# Patient Record
Sex: Female | Born: 2000 | Race: White | Hispanic: No | Marital: Single | Smoking: Former smoker
Health system: Southern US, Community
[De-identification: ages and names within clinical notes are randomized; demographics above are authoritative.]

## PROBLEM LIST (undated history)

## (undated) DIAGNOSIS — F909 Attention-deficit hyperactivity disorder, unspecified type: Secondary | ICD-10-CM

## (undated) DIAGNOSIS — G43109 Migraine with aura, not intractable, without status migrainosus: Secondary | ICD-10-CM

## (undated) DIAGNOSIS — J45909 Unspecified asthma, uncomplicated: Secondary | ICD-10-CM

## (undated) DIAGNOSIS — F419 Anxiety disorder, unspecified: Secondary | ICD-10-CM

## (undated) DIAGNOSIS — F32A Depression, unspecified: Secondary | ICD-10-CM

## (undated) DIAGNOSIS — T7840XA Allergy, unspecified, initial encounter: Secondary | ICD-10-CM

## (undated) HISTORY — DX: Anxiety disorder, unspecified: F41.9

## (undated) HISTORY — PX: MOUTH SURGERY: SHX715

## (undated) HISTORY — DX: Unspecified asthma, uncomplicated: J45.909

## (undated) HISTORY — DX: Depression, unspecified: F32.A

## (undated) HISTORY — DX: Allergy, unspecified, initial encounter: T78.40XA

## (undated) HISTORY — DX: Migraine with aura, not intractable, without status migrainosus: G43.109

---
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
## 2018-01-20 MED FILL — FLUoxetine HCL 40 MG CAPS: 40 | 30 days supply | Qty: 30 | Fill #2

## 2018-01-20 MED FILL — BLISOVI 24 FE TABLET: 1-20 | 28 days supply | Qty: 28 | Fill #3

## 2018-01-20 MED FILL — SM FEXOFENADINE HCL 180 MG: 180 MG | 90 days supply | Qty: 90 | Fill #1

## 2018-01-20 MED FILL — busPIRone HCL 10 MG TABS: 10 | 30 days supply | Qty: 60 | Fill #1

## 2018-01-23 DIAGNOSIS — G43009 Migraine without aura, not intractable, without status migrainosus: Secondary | ICD-10-CM | POA: Diagnosis not present

## 2018-01-23 DIAGNOSIS — S8002XA Contusion of left knee, initial encounter: Secondary | ICD-10-CM | POA: Diagnosis not present

## 2018-01-23 DIAGNOSIS — F419 Anxiety disorder, unspecified: Secondary | ICD-10-CM | POA: Diagnosis not present

## 2018-02-06 IMAGING — CR DG ANKLE COMPLETE 3+V*R*
3 series · 3 of 3 positions shown · non-contrast
Comparison: None.

CLINICAL DATA: Bilateral posterior ankle pain with swelling and
bruising for 1 month. No known injury.

EXAM:
RIGHT ANKLE - COMPLETE 3+ VIEW

[t ankle joint ap right]
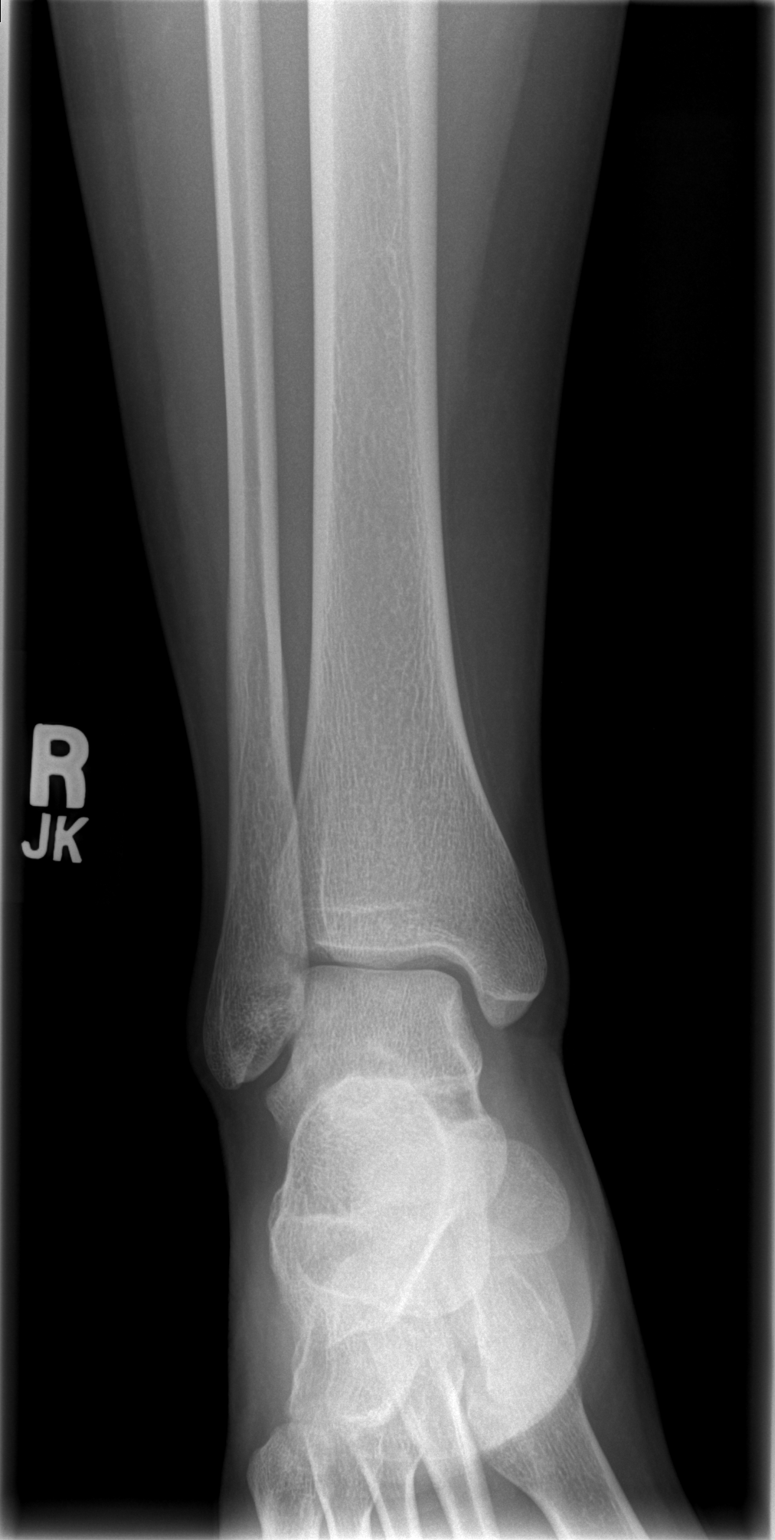

[t ankle joint oblique right]
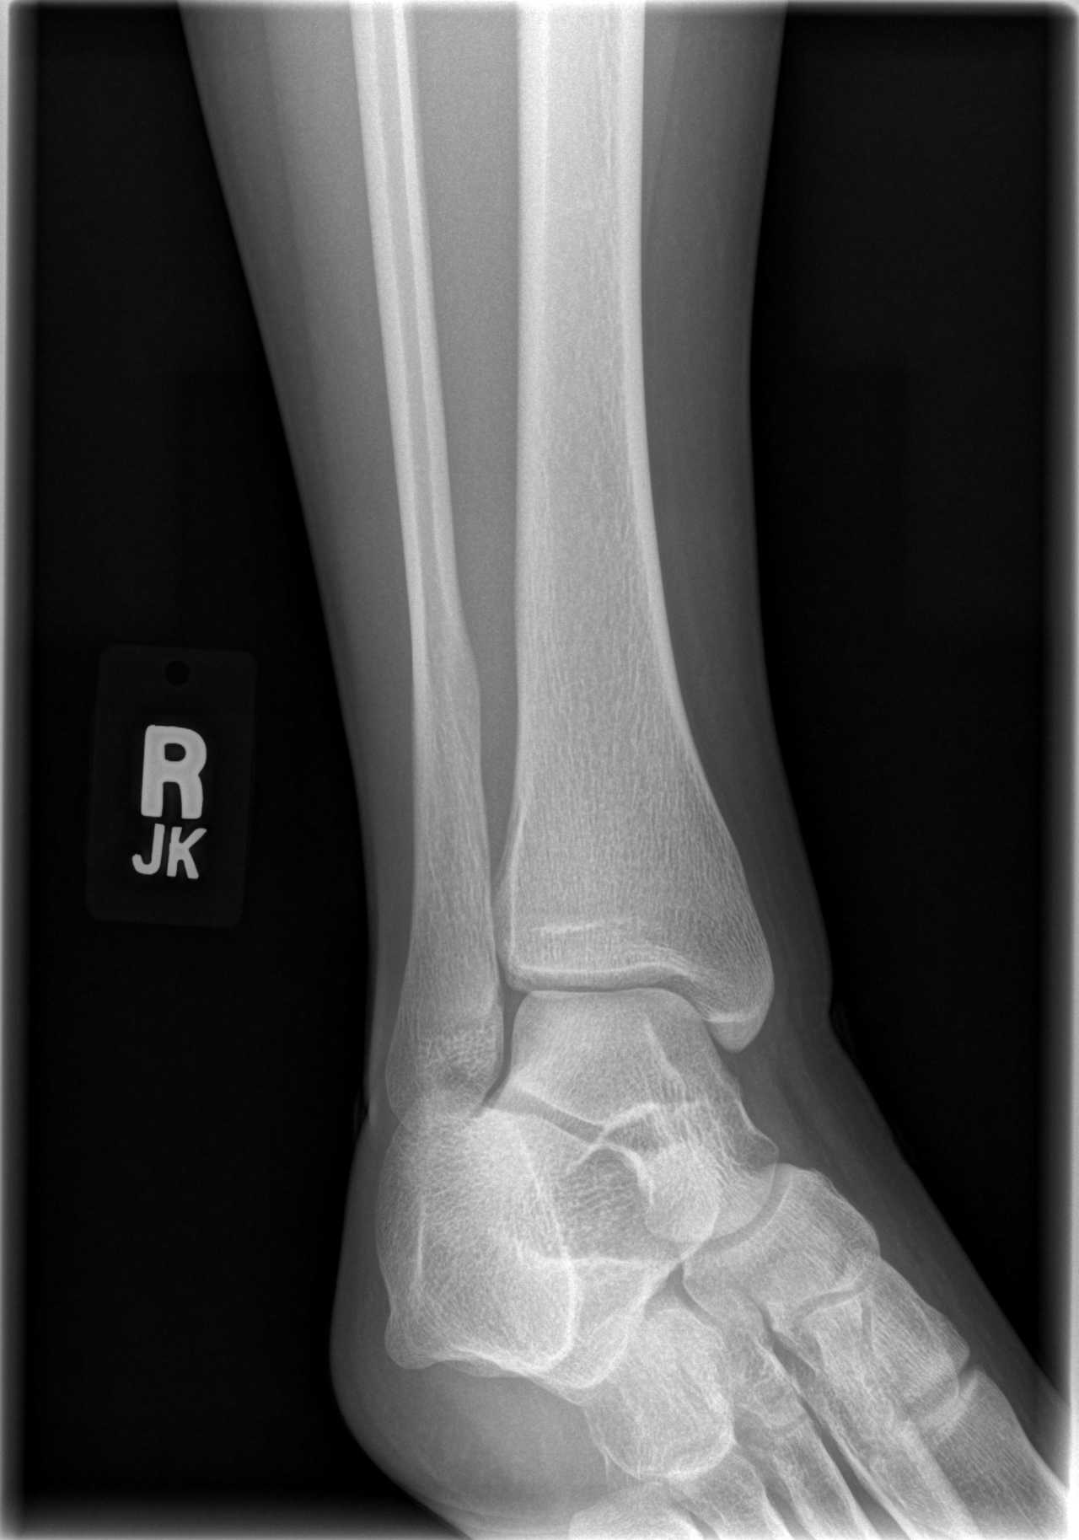

[t ankle joint lat right]
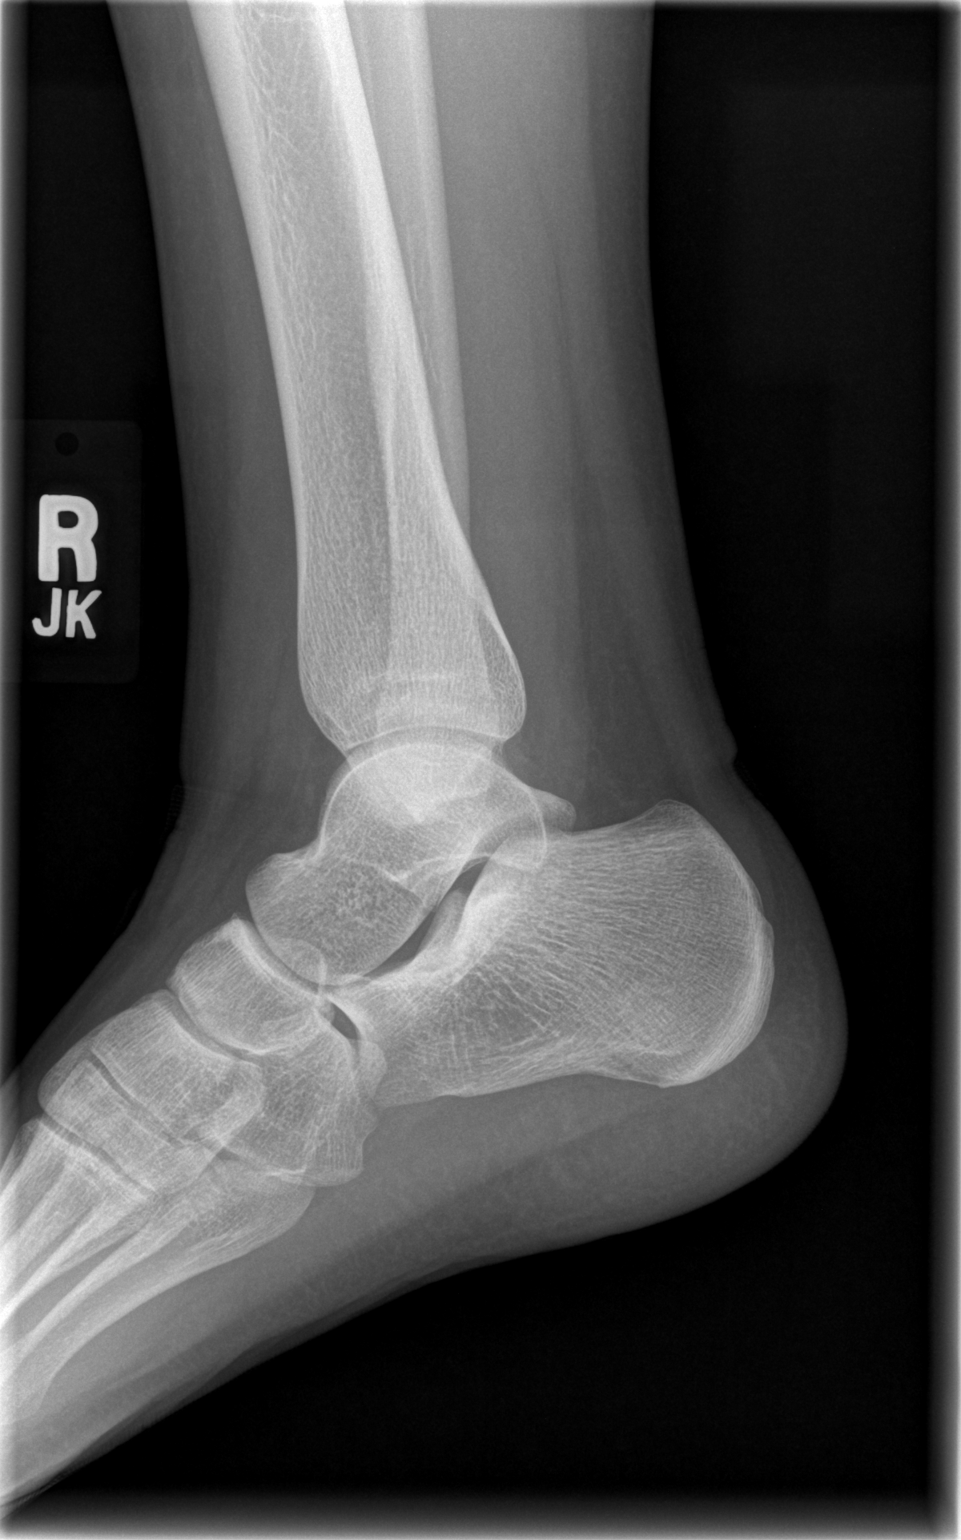

[3 of 3 positions shown; findings below may reference images not displayed]

FINDINGS: There is no evidence of fracture, dislocation, or joint effusion.
There is no evidence of arthropathy or other focal bone abnormality.
Soft tissues are unremarkable. Normal Achilles tendon shadow.
IMPRESSION: Normal study.

## 2018-03-03 MED FILL — BLISOVI 24 FE TABLET: 1-20 | 84 days supply | Qty: 84 | Fill #4

## 2018-03-03 MED FILL — busPIRone HCL 10 MG TABS: 10 | 30 days supply | Qty: 60 | Fill #2

## 2018-03-03 MED FILL — FLUoxetine HCL 40 MG CAPS: 40 | 30 days supply | Qty: 30 | Fill #3

## 2018-06-26 MED FILL — FLUoxetine HCL 40 MG CAPS: 40 | 30 days supply | Qty: 30 | Fill #4

## 2018-06-27 DIAGNOSIS — G43009 Migraine without aura, not intractable, without status migrainosus: Secondary | ICD-10-CM | POA: Diagnosis not present

## 2018-06-27 DIAGNOSIS — F419 Anxiety disorder, unspecified: Secondary | ICD-10-CM | POA: Diagnosis not present

## 2018-06-27 DIAGNOSIS — J301 Allergic rhinitis due to pollen: Secondary | ICD-10-CM | POA: Diagnosis not present

## 2018-06-28 MED FILL — SUMATRIPTAN SUCC 25 MG TAB: 25 | 90 days supply | Qty: 20 | Fill #0

## 2018-06-28 MED FILL — ALPRAZolam 0.25 MG TABS: 0.25 | 30 days supply | Qty: 15 | Fill #0

## 2018-06-28 MED FILL — busPIRone HCL 10 MG TABS: 10 | 90 days supply | Qty: 180 | Fill #0

## 2018-09-21 MED FILL — FLUoxetine HCL 40 MG CAPS: 40 | 90 days supply | Qty: 90 | Fill #0

## 2018-09-21 MED FILL — busPIRone HCL 10 MG TABS: 10 | 90 days supply | Qty: 180 | Fill #1

## 2018-11-06 DIAGNOSIS — F419 Anxiety disorder, unspecified: Secondary | ICD-10-CM | POA: Diagnosis not present

## 2018-11-06 DIAGNOSIS — G43009 Migraine without aura, not intractable, without status migrainosus: Secondary | ICD-10-CM | POA: Diagnosis not present

## 2018-11-06 MED FILL — BLISOVI 24 FE TABLET: 1-20 | 84 days supply | Qty: 84 | Fill #0

## 2019-01-10 DIAGNOSIS — F419 Anxiety disorder, unspecified: Secondary | ICD-10-CM | POA: Diagnosis not present

## 2019-01-10 MED FILL — FLUoxetine HCL 20 MG CAPS: 20 | 30 days supply | Qty: 30 | Fill #0

## 2019-01-10 MED FILL — ALPRAZolam 0.25 MG TABS: 0.25 | 30 days supply | Qty: 15 | Fill #0

## 2019-01-29 MED FILL — BLISOVI 24 FE 1-20 MG-MCG(2: 1-20 | 84 days supply | Qty: 84 | Fill #1

## 2019-03-07 MED FILL — FEXOFENADINE HCL 180 MG TAB: 180 | 30 days supply | Qty: 30 | Fill #0

## 2019-03-07 MED FILL — FLUoxetine HCL 20 MG CAPS: 20 | 30 days supply | Qty: 30 | Fill #0

## 2019-03-19 MED FILL — SUMATRIPTAN SUCC 25 MG TAB: 25 | 90 days supply | Qty: 20 | Fill #0

## 2019-03-21 DIAGNOSIS — F419 Anxiety disorder, unspecified: Secondary | ICD-10-CM | POA: Diagnosis not present

## 2019-03-21 MED FILL — SERTRALINE HCL 50 MG TABLET: 50 | 30 days supply | Qty: 30 | Fill #0 | Status: TO

## 2019-04-23 MED FILL — SERTRALINE HCL 50 MG TABLET: 50 | 30 days supply | Qty: 30 | Fill #0

## 2019-04-25 MED FILL — FEXOFENADINE HCL 180 MG TAB: 180 | 30 days supply | Qty: 30 | Fill #0

## 2019-04-25 MED FILL — BLISOVI 24 FE 1-20 MG-MCG(2: 1-20 | 84 days supply | Qty: 84 | Fill #0

## 2019-04-26 MED FILL — ALBUTEROL SULFATE HFA 108 (: 108 (90 BAS | 25 days supply | Qty: 18 | Fill #0

## 2019-05-17 MED FILL — SERTRALINE HCL 50 MG TABLET: 50 | 30 days supply | Qty: 30 | Fill #0

## 2019-06-18 MED FILL — SERTRALINE HCL 50 MG TABLET: 50 | 30 days supply | Qty: 30 | Fill #1

## 2019-07-04 DIAGNOSIS — F419 Anxiety disorder, unspecified: Secondary | ICD-10-CM | POA: Diagnosis not present

## 2019-07-04 MED FILL — ALPRAZolam 0.5 MG TABS: 0.5 | 30 days supply | Qty: 30 | Fill #0

## 2019-07-04 MED FILL — SERTRALINE HCL 100 MG TAB: 100 | 90 days supply | Qty: 90 | Fill #0

## 2019-07-17 MED FILL — BLISOVI 24 FE 1-20 MG-MCG(2: 1-20 | 28 days supply | Qty: 28 | Fill #0

## 2019-07-21 DIAGNOSIS — Z20828 Contact with and (suspected) exposure to other viral communicable diseases: Secondary | ICD-10-CM | POA: Diagnosis not present

## 2019-09-25 MED FILL — SERTRALINE HCL 100 MG TAB: 100 | 90 days supply | Qty: 90 | Fill #0

## 2019-09-26 MED FILL — BLISOVI 24 FE 1-20 MG-MCG(2: 1-20 | 84 days supply | Qty: 84 | Fill #0

## 2019-09-26 MED FILL — ALPRAZolam 0.5 MG TABS: 0.5 | 30 days supply | Qty: 30 | Fill #0

## 2019-09-28 DIAGNOSIS — Z20828 Contact with and (suspected) exposure to other viral communicable diseases: Secondary | ICD-10-CM | POA: Diagnosis not present

## 2020-01-11 DIAGNOSIS — Z20828 Contact with and (suspected) exposure to other viral communicable diseases: Secondary | ICD-10-CM | POA: Diagnosis not present

## 2020-02-27 DIAGNOSIS — Z6820 Body mass index (BMI) 20.0-20.9, adult: Secondary | ICD-10-CM | POA: Diagnosis not present

## 2020-02-27 DIAGNOSIS — Z01419 Encounter for gynecological examination (general) (routine) without abnormal findings: Secondary | ICD-10-CM | POA: Diagnosis not present

## 2020-06-26 DIAGNOSIS — Z20822 Contact with and (suspected) exposure to covid-19: Secondary | ICD-10-CM | POA: Diagnosis not present

## 2020-12-05 ENCOUNTER — Telehealth: Payer: 59 | Admitting: Nurse Practitioner

## 2020-12-05 DIAGNOSIS — N3000 Acute cystitis without hematuria: Secondary | ICD-10-CM | POA: Diagnosis not present

## 2020-12-05 MED ORDER — CEPHALEXIN 500 MG PO CAPS
500.0000 mg | ORAL_CAPSULE | Freq: Two times a day (BID) | ORAL | 0 refills | Status: DC
Start: 2020-12-05 — End: 2021-05-12

## 2020-12-05 NOTE — Progress Notes (Signed)
We are sorry that you are not feeling well.  Here is how we plan to help!  Based on what you shared with me it looks like you most likely have a simple urinary tract infection.  A UTI (Urinary Tract Infection) is a bacterial infection of the bladder.  Most cases of urinary tract infections are simple to treat but a key part of your care is to encourage you to drink plenty of fluids and watch your symptoms carefully.  I have prescribed Keflex 500 mg twice a day for 7 days.  Your symptoms should gradually improve. If the burning in your urine worsens, you develop worsening fever, back pain or pelvic pain or if your symptoms do not resolve after completing the antibiotic, then you wll need a fac eto face visit.  Urinary tract infections can be prevented by drinking plenty of water to keep your body hydrated.  Also be sure when you wipe, wipe from front to back and don't hold it in!  If possible, empty your bladder every 4 hours.  Your e-visit answers were reviewed by a board certified advanced clinical practitioner to complete your personal care plan.  Depending on the condition, your plan could have included both over the counter or prescription medications.  If there is a problem please reply  once you have received a response from your provider.  Your safety is important to [place].  If you have drug allergies check your prescription carefully.    You can use MyChart to ask questions about todays visit, request a non-urgent call back, or ask for a work or school excuse for 24 hours related to this e-Visit. If it has been greater than 24 hours you will need to follow up with your provider, or enter a new e-Visit to address those concerns.   You will get an e-mail in the next two days asking about your experience.  I hope that your e-visit has been valuable and will speed your recovery. Thank you for using e-visits.  5-10 minutes spent reviewing and documenting in chart.

## 2020-12-19 ENCOUNTER — Telehealth: Payer: 59 | Admitting: Physician Assistant

## 2020-12-19 DIAGNOSIS — R102 Pelvic and perineal pain: Secondary | ICD-10-CM

## 2020-12-19 DIAGNOSIS — N12 Tubulo-interstitial nephritis, not specified as acute or chronic: Secondary | ICD-10-CM

## 2020-12-19 NOTE — Progress Notes (Signed)
Based on what you shared with me, I feel your condition warrants further evaluation and I recommend that you be seen for a face to face office visit. Giving ongoing and severe symptoms, along with symptoms concerning for kidney infection, you need to be seen in person.    NOTE: If you entered your credit card information for this eVisit, you will not be charged. You may see a "hold" on your card for the $35 but that hold will drop off and you will not have a charge processed.   If you are having a true medical emergency please call 911.      For an urgent face to face visit, [facility] has five urgent care centers for your convenience:     [facility] [place] [facility] [place] Get Driving Directions [phone] [place], [place] [address] . 10 am - 6pm Monday - Friday    [place] [facility] [place] [facility]) Get Driving Directions [phone] [address] [place], [place] [address] . 10 am to 8 pm Monday-Friday . 12 pm to 8 pm [facility] Get Driving Directions [phone] [place], [place], [place] [address] . 8 am to 8 pm Monday-Friday . 9 am to 6 pm Saturday . 11 am to 6 pm Sunday     [facility] Get Driving Directions  [phone] [address] [place], [place] [address] . 8 am to 8 pm Monday-Friday . 8 am to 4 pm [facility] at [place] Get Driving Directions [phone] [address] [place], [place] [address] . 12 pm to 6 pm Monday-Friday      Your e-visit answers were reviewed by a board certified advanced clinical practitioner to complete your personal care plan.  Thank you for using e-Visits.

## 2020-12-20 DIAGNOSIS — H538 Other visual disturbances: Secondary | ICD-10-CM | POA: Diagnosis not present

## 2020-12-20 DIAGNOSIS — R5383 Other fatigue: Secondary | ICD-10-CM | POA: Diagnosis not present

## 2020-12-20 DIAGNOSIS — N3000 Acute cystitis without hematuria: Secondary | ICD-10-CM | POA: Diagnosis not present

## 2020-12-20 DIAGNOSIS — Z20822 Contact with and (suspected) exposure to covid-19: Secondary | ICD-10-CM | POA: Diagnosis not present

## 2020-12-20 DIAGNOSIS — R112 Nausea with vomiting, unspecified: Secondary | ICD-10-CM | POA: Diagnosis not present

## 2020-12-20 DIAGNOSIS — R109 Unspecified abdominal pain: Secondary | ICD-10-CM | POA: Diagnosis not present

## 2020-12-20 DIAGNOSIS — R300 Dysuria: Secondary | ICD-10-CM | POA: Diagnosis not present

## 2020-12-20 DIAGNOSIS — J45909 Unspecified asthma, uncomplicated: Secondary | ICD-10-CM | POA: Diagnosis not present

## 2020-12-23 DIAGNOSIS — R109 Unspecified abdominal pain: Secondary | ICD-10-CM | POA: Diagnosis not present

## 2020-12-23 DIAGNOSIS — N898 Other specified noninflammatory disorders of vagina: Secondary | ICD-10-CM | POA: Diagnosis not present

## 2020-12-23 DIAGNOSIS — R309 Painful micturition, unspecified: Secondary | ICD-10-CM | POA: Diagnosis not present

## 2020-12-23 DIAGNOSIS — R1032 Left lower quadrant pain: Secondary | ICD-10-CM | POA: Diagnosis not present

## 2020-12-23 DIAGNOSIS — R112 Nausea with vomiting, unspecified: Secondary | ICD-10-CM | POA: Diagnosis not present

## 2020-12-23 DIAGNOSIS — K5900 Constipation, unspecified: Secondary | ICD-10-CM | POA: Diagnosis not present

## 2020-12-23 DIAGNOSIS — R634 Abnormal weight loss: Secondary | ICD-10-CM | POA: Diagnosis not present

## 2021-01-09 DIAGNOSIS — F419 Anxiety disorder, unspecified: Secondary | ICD-10-CM | POA: Diagnosis not present

## 2021-01-09 DIAGNOSIS — R110 Nausea: Secondary | ICD-10-CM | POA: Diagnosis not present

## 2021-01-09 DIAGNOSIS — F509 Eating disorder, unspecified: Secondary | ICD-10-CM | POA: Diagnosis not present

## 2021-01-09 MED FILL — ALBUTEROL SULFATE HFA 108 (: 108 (90 BAS | 25 days supply | Qty: 18 | Fill #0

## 2021-01-09 MED FILL — ALPRAZolam 0.5 MG TABS: 0.5 | 30 days supply | Qty: 90 | Fill #0

## 2021-01-09 MED FILL — ONDANSETRON ODT 4 MG TABLET: 4 | 15 days supply | Qty: 45 | Fill #0

## 2021-01-12 ENCOUNTER — Other Ambulatory Visit (HOSPITAL_COMMUNITY): Payer: Self-pay

## 2021-01-14 DIAGNOSIS — R1032 Left lower quadrant pain: Secondary | ICD-10-CM | POA: Diagnosis not present

## 2021-01-19 DIAGNOSIS — S239XXA Sprain of unspecified parts of thorax, initial encounter: Secondary | ICD-10-CM | POA: Diagnosis not present

## 2021-01-19 DIAGNOSIS — Z041 Encounter for examination and observation following transport accident: Secondary | ICD-10-CM | POA: Diagnosis not present

## 2021-01-19 DIAGNOSIS — S139XXA Sprain of joints and ligaments of unspecified parts of neck, initial encounter: Secondary | ICD-10-CM | POA: Diagnosis not present

## 2021-01-21 MED FILL — ONDANSETRON ODT 4 MG TABLET: 4 | 15 days supply | Qty: 45 | Fill #0

## 2021-02-05 ENCOUNTER — Other Ambulatory Visit (HOSPITAL_BASED_OUTPATIENT_CLINIC_OR_DEPARTMENT_OTHER): Payer: Self-pay

## 2021-02-16 DIAGNOSIS — F509 Eating disorder, unspecified: Secondary | ICD-10-CM | POA: Diagnosis not present

## 2021-02-16 DIAGNOSIS — K5909 Other constipation: Secondary | ICD-10-CM | POA: Diagnosis not present

## 2021-03-04 DIAGNOSIS — N760 Acute vaginitis: Secondary | ICD-10-CM | POA: Diagnosis not present

## 2021-03-04 DIAGNOSIS — F509 Eating disorder, unspecified: Secondary | ICD-10-CM | POA: Diagnosis not present

## 2021-03-04 DIAGNOSIS — R309 Painful micturition, unspecified: Secondary | ICD-10-CM | POA: Diagnosis not present

## 2021-03-31 DIAGNOSIS — F331 Major depressive disorder, recurrent, moderate: Secondary | ICD-10-CM | POA: Diagnosis not present

## 2021-03-31 DIAGNOSIS — F509 Eating disorder, unspecified: Secondary | ICD-10-CM | POA: Diagnosis not present

## 2021-05-11 ENCOUNTER — Ambulatory Visit: Payer: 59 | Attending: Critical Care Medicine

## 2021-05-11 DIAGNOSIS — Z20822 Contact with and (suspected) exposure to covid-19: Secondary | ICD-10-CM | POA: Diagnosis not present

## 2021-05-12 ENCOUNTER — Emergency Department
Admission: EM | Admit: 2021-05-12 | Discharge: 2021-05-12 | Disposition: A | Discharge status: Home / Self Care | Payer: 59 | Source: Home / Self Care | Attending: Family Medicine | Admitting: Family Medicine

## 2021-05-12 ENCOUNTER — Encounter: Payer: Self-pay | Admitting: Emergency Medicine

## 2021-05-12 ENCOUNTER — Other Ambulatory Visit: Payer: Self-pay

## 2021-05-12 DIAGNOSIS — J029 Acute pharyngitis, unspecified: Secondary | ICD-10-CM | POA: Diagnosis not present

## 2021-05-12 LAB — NOVEL CORONAVIRUS, NAA: SARS-CoV-2, NAA: DETECTED — AB

## 2021-05-12 LAB — POCT RAPID STREP A (OFFICE): Rapid Strep A Screen: NEGATIVE

## 2021-05-12 LAB — SARS-COV-2, NAA 2 DAY TAT

## 2021-05-12 NOTE — ED Provider Notes (Signed)
[facility]    CSN: [id number] Arrival date & time: 05/12/21  1229      History   Chief Complaint Chief Complaint  Patient presents with   Otalgia    HPI CHRISTMAS FARACI is a 20 y.o. female.   HPI patient is here for viral symptoms.  She has a sore throat, ear pressure and pain, headache and body aches, and fever.  She went this morning for COVID test.  She is here today for evaluation of her ear pain.  Also complains of a sore throat.  She has decreased appetite and nausea but no vomiting or diarrhea.  No known exposure to COVID.  She is COVID vaccinated.  Past Medical History:  Diagnosis Date   Allergy    Asthma     There are no problems to display for this patient.   Past Surgical History:  Procedure Laterality Date   MOUTH SURGERY      OB History   No obstetric history on file.      Home Medications    Prior to Admission medications   Medication Sig Start Date End Date Taking? Authorizing Provider  albuterol (VENTOLIN HFA) 108 (90 Base) MCG/ACT inhaler INHALE 2 PUFF(S) BY MOUTH 4 TIMES A DAY 01/12/21 01/12/22    ALPRAZolam (XANAX) 0.5 MG tablet TAKE 1 TABLET BY MOUTH 3 TIMES A DAY X 30 DAYS AS NEEDED FOR ANXIETY 01/12/21 07/11/21    Fexofenadine HCl (ALLEGRA PO) Take by mouth.    [provider]  ondansetron (ZOFRAN-ODT) 4 MG disintegrating tablet DISSOLVE 1 TABLET BY MOUTH EVERY 8 HOURS 01/12/21 01/12/22      Family History Family History  Problem Relation Age of Onset   Healthy Mother    Healthy Father     Social History Social History   Tobacco Use   Smoking status: Never    Passive exposure: Never   Smokeless tobacco: Never  Vaping Use   Vaping Use: Never used  Substance Use Topics   Alcohol use: Yes   Drug use: Not Currently     Allergies   Patient has no known allergies.   Review of Systems Review of Systems See HPI  Physical Exam Triage Vital Signs ED Triage Vitals  Enc Vitals Group     BP 05/12/21 1309  100/70     Pulse Rate 05/12/21 1309 (!) 101     Resp 05/12/21 1309 18     Temp 05/12/21 1309 99.7 F (37.6 C)     Temp Source 05/12/21 1309 Oral     SpO2 05/12/21 1309 100 %     Weight 05/12/21 1311 130 lb (59 kg)     Height 05/12/21 1311 5\' 7"  (1.702 m)     Head Circumference --      Peak Flow --      Pain Score 05/12/21 1310 4     Pain Loc --      Pain Edu? --      Excl. in [place]? --    No data found.  Updated Vital Signs BP 100/70 (BP Location: Right Arm)   Pulse (!) 101   Temp 99.7 F (37.6 C) (Oral)   Resp 18   Ht 5\' 7"  (1.702 m)   Wt 59 kg   SpO2 100%   BMI 20.36 kg/m  Physical Exam Constitutional:      General: She is not in acute distress.    Appearance: She is well-developed.     Comments: Appears tired  HENT:     Head: Normocephalic and atraumatic.     Ears:     Comments: Both TMs are slightly dull with no injection or bulging    Nose: Congestion present.     Mouth/Throat:     Pharynx: Posterior oropharyngeal erythema present.  Eyes:     Conjunctiva/sclera: Conjunctivae normal.     Pupils: Pupils are equal, round, and reactive to light.  Neck:     Comments: no anterior or posterior cervical nodes Cardiovascular:     Rate and Rhythm: Normal rate and regular rhythm.     Heart sounds: Normal heart sounds.  Pulmonary:     Effort: Pulmonary effort is normal. No respiratory distress.     Breath sounds: Normal breath sounds.  Abdominal:     General: There is no distension.     Palpations: Abdomen is soft.     Comments: No tenderness or organomegaly  Musculoskeletal:        General: Normal range of motion.     Cervical back: Normal range of motion.  Lymphadenopathy:     Cervical: No cervical adenopathy.  Skin:    General: Skin is warm and dry.  Neurological:     General: No focal deficit present.     Mental Status: She is alert.  Psychiatric:        Mood and Affect: Mood normal.     UC Treatments / Results  Labs (all labs ordered are listed, but  only abnormal results are displayed) Labs Reviewed  CULTURE, GROUP A STREP  POCT RAPID STREP A (OFFICE)    EKG   Radiology No results found.  Procedures Procedures (including critical care time)  Medications Ordered in UC Medications - No data to display  Initial Impression / Assessment and Plan / UC Course  I have reviewed the triage vital signs and the nursing notes.  Pertinent labs & imaging results that were available during my care of the patient were reviewed by me and considered in my medical decision making (see chart for details).     Rapid strep is negative.  Symptoms appear to be a viral syndrome.  COVID and flu testing done.  Patient is sent home with symptomatic care Final Clinical Impressions(s) / UC Diagnoses   Final diagnoses:  Acute pharyngitis, unspecified etiology     Discharge Instructions      Strep test is negative I think this is a viral infection You need to drink lots of fluids You need to use something like Flonase or a nasal steroid to reduce the pressure in your ears Wait for COVID test results No indication for antibiotics at this visit   ED Prescriptions   None    PDMP not reviewed this encounter.   Raylene Everts, MD 05/12/21 [phone]

## 2021-05-12 NOTE — Discharge Instructions (Addendum)
Strep test is negative I think this is a viral infection You need to drink lots of fluids You need to use something like Flonase or a nasal steroid to reduce the pressure in your ears Wait for COVID test results No indication for antibiotics at this visit

## 2021-05-12 NOTE — ED Triage Notes (Signed)
RT ear pain after swimming, congestion, runny nose, body aches, fever today, fatigue, slight cough, nausea, sore throat, Negative at home Covid test, PCR pending. Vaccinated

## 2021-05-14 DIAGNOSIS — F331 Major depressive disorder, recurrent, moderate: Secondary | ICD-10-CM | POA: Diagnosis not present

## 2021-05-14 DIAGNOSIS — F411 Generalized anxiety disorder: Secondary | ICD-10-CM | POA: Diagnosis not present

## 2021-05-15 LAB — CULTURE, GROUP A STREP: Strep A Culture: NEGATIVE

## 2021-05-20 DIAGNOSIS — F411 Generalized anxiety disorder: Secondary | ICD-10-CM | POA: Diagnosis not present

## 2021-05-20 DIAGNOSIS — F331 Major depressive disorder, recurrent, moderate: Secondary | ICD-10-CM | POA: Diagnosis not present

## 2021-06-03 DIAGNOSIS — F411 Generalized anxiety disorder: Secondary | ICD-10-CM | POA: Diagnosis not present

## 2021-06-03 DIAGNOSIS — F331 Major depressive disorder, recurrent, moderate: Secondary | ICD-10-CM | POA: Diagnosis not present

## 2021-06-11 DIAGNOSIS — F331 Major depressive disorder, recurrent, moderate: Secondary | ICD-10-CM | POA: Diagnosis not present

## 2021-06-11 DIAGNOSIS — F411 Generalized anxiety disorder: Secondary | ICD-10-CM | POA: Diagnosis not present

## 2021-06-19 ENCOUNTER — Other Ambulatory Visit (HOSPITAL_COMMUNITY): Payer: Self-pay

## 2021-06-19 DIAGNOSIS — L218 Other seborrheic dermatitis: Secondary | ICD-10-CM | POA: Diagnosis not present

## 2021-06-19 DIAGNOSIS — D225 Melanocytic nevi of trunk: Secondary | ICD-10-CM | POA: Diagnosis not present

## 2021-06-19 DIAGNOSIS — L503 Dermatographic urticaria: Secondary | ICD-10-CM | POA: Diagnosis not present

## 2021-06-19 DIAGNOSIS — D2262 Melanocytic nevi of left upper limb, including shoulder: Secondary | ICD-10-CM | POA: Diagnosis not present

## 2021-06-19 MED ORDER — KETOCONAZOLE 2 % EX CREA
TOPICAL_CREAM | CUTANEOUS | 2 refills | Status: AC
  Filled 2021-06-19: qty 30, 15d supply, fill #0

## 2021-06-19 MED ORDER — HYDROCORTISONE 2.5 % EX CREA
TOPICAL_CREAM | CUTANEOUS | 2 refills | Status: AC
  Filled 2021-06-19: qty 30, 15d supply, fill #0

## 2021-06-19 MED ORDER — BETAMETHASONE DIPROPIONATE 0.05 % EX LOTN
TOPICAL_LOTION | CUTANEOUS | 3 refills | Status: AC
  Filled 2021-06-19: qty 120, 30d supply, fill #0

## 2021-06-19 MED ORDER — TRIAMCINOLONE ACETONIDE 0.1 % EX CREA
TOPICAL_CREAM | CUTANEOUS | 0 refills | Status: AC
  Filled 2021-06-19: qty 30, 14d supply, fill #0

## 2021-06-25 ENCOUNTER — Other Ambulatory Visit (HOSPITAL_COMMUNITY): Payer: Self-pay

## 2021-06-25 DIAGNOSIS — F509 Eating disorder, unspecified: Secondary | ICD-10-CM | POA: Diagnosis not present

## 2021-06-25 DIAGNOSIS — G4700 Insomnia, unspecified: Secondary | ICD-10-CM | POA: Diagnosis not present

## 2021-06-25 DIAGNOSIS — F419 Anxiety disorder, unspecified: Secondary | ICD-10-CM | POA: Diagnosis not present

## 2021-06-25 MED ORDER — TRAZODONE HCL 50 MG PO TABS
ORAL_TABLET | ORAL | 1 refills | Status: DC
  Filled 2021-06-25: qty 30, 30d supply, fill #0

## 2021-06-30 DIAGNOSIS — F331 Major depressive disorder, recurrent, moderate: Secondary | ICD-10-CM | POA: Diagnosis not present

## 2021-06-30 DIAGNOSIS — F411 Generalized anxiety disorder: Secondary | ICD-10-CM | POA: Diagnosis not present

## 2021-07-09 DIAGNOSIS — Z0100 Encounter for examination of eyes and vision without abnormal findings: Secondary | ICD-10-CM | POA: Diagnosis not present

## 2021-07-14 DIAGNOSIS — F411 Generalized anxiety disorder: Secondary | ICD-10-CM | POA: Diagnosis not present

## 2021-07-14 DIAGNOSIS — F331 Major depressive disorder, recurrent, moderate: Secondary | ICD-10-CM | POA: Diagnosis not present

## 2021-08-06 DIAGNOSIS — F411 Generalized anxiety disorder: Secondary | ICD-10-CM | POA: Diagnosis not present

## 2021-08-06 DIAGNOSIS — F331 Major depressive disorder, recurrent, moderate: Secondary | ICD-10-CM | POA: Diagnosis not present

## 2021-08-27 DIAGNOSIS — F411 Generalized anxiety disorder: Secondary | ICD-10-CM | POA: Diagnosis not present

## 2021-08-27 DIAGNOSIS — F331 Major depressive disorder, recurrent, moderate: Secondary | ICD-10-CM | POA: Diagnosis not present

## 2021-09-11 DIAGNOSIS — F411 Generalized anxiety disorder: Secondary | ICD-10-CM | POA: Diagnosis not present

## 2021-09-11 DIAGNOSIS — F331 Major depressive disorder, recurrent, moderate: Secondary | ICD-10-CM | POA: Diagnosis not present

## 2021-10-13 ENCOUNTER — Other Ambulatory Visit (HOSPITAL_COMMUNITY): Payer: Self-pay

## 2021-10-13 DIAGNOSIS — F419 Anxiety disorder, unspecified: Secondary | ICD-10-CM | POA: Diagnosis not present

## 2021-10-13 DIAGNOSIS — Z23 Encounter for immunization: Secondary | ICD-10-CM | POA: Diagnosis not present

## 2021-10-13 DIAGNOSIS — G4700 Insomnia, unspecified: Secondary | ICD-10-CM | POA: Diagnosis not present

## 2021-10-13 DIAGNOSIS — F909 Attention-deficit hyperactivity disorder, unspecified type: Secondary | ICD-10-CM | POA: Diagnosis not present

## 2021-10-13 MED ORDER — BUPROPION HCL ER (XL) 150 MG PO TB24
ORAL_TABLET | ORAL | 0 refills | Status: DC
  Filled 2021-10-13: qty 180, 90d supply, fill #0

## 2021-10-13 MED ORDER — TRAZODONE HCL 50 MG PO TABS
ORAL_TABLET | ORAL | 1 refills | Status: DC
  Filled 2021-10-13: qty 30, 30d supply, fill #0

## 2021-12-14 ENCOUNTER — Other Ambulatory Visit (HOSPITAL_COMMUNITY): Payer: Self-pay

## 2021-12-14 DIAGNOSIS — F419 Anxiety disorder, unspecified: Secondary | ICD-10-CM | POA: Diagnosis not present

## 2021-12-14 DIAGNOSIS — F909 Attention-deficit hyperactivity disorder, unspecified type: Secondary | ICD-10-CM | POA: Diagnosis not present

## 2021-12-14 MED ORDER — BUPROPION HCL ER (SR) 100 MG PO TB12
100.0000 mg | ORAL_TABLET | Freq: Every day | ORAL | 0 refills | Status: DC
  Filled 2021-12-14: qty 90, 90d supply, fill #0

## 2021-12-14 MED ORDER — ALPRAZOLAM 0.5 MG PO TABS
0.5000 mg | ORAL_TABLET | Freq: Three times a day (TID) | ORAL | 0 refills | Status: DC | PRN
  Filled 2021-12-14: qty 90, 30d supply, fill #0

## 2021-12-15 ENCOUNTER — Other Ambulatory Visit (HOSPITAL_COMMUNITY): Payer: Self-pay

## 2022-01-05 DIAGNOSIS — F411 Generalized anxiety disorder: Secondary | ICD-10-CM | POA: Diagnosis not present

## 2022-01-05 DIAGNOSIS — F331 Major depressive disorder, recurrent, moderate: Secondary | ICD-10-CM | POA: Diagnosis not present

## 2022-01-12 DIAGNOSIS — F331 Major depressive disorder, recurrent, moderate: Secondary | ICD-10-CM | POA: Diagnosis not present

## 2022-01-12 DIAGNOSIS — F411 Generalized anxiety disorder: Secondary | ICD-10-CM | POA: Diagnosis not present

## 2022-02-10 DIAGNOSIS — F331 Major depressive disorder, recurrent, moderate: Secondary | ICD-10-CM | POA: Diagnosis not present

## 2022-02-10 DIAGNOSIS — F411 Generalized anxiety disorder: Secondary | ICD-10-CM | POA: Diagnosis not present

## 2022-02-22 DIAGNOSIS — F411 Generalized anxiety disorder: Secondary | ICD-10-CM | POA: Diagnosis not present

## 2022-02-22 DIAGNOSIS — F331 Major depressive disorder, recurrent, moderate: Secondary | ICD-10-CM | POA: Diagnosis not present

## 2022-03-01 DIAGNOSIS — Z1151 Encounter for screening for human papillomavirus (HPV): Secondary | ICD-10-CM | POA: Diagnosis not present

## 2022-03-01 DIAGNOSIS — Z6822 Body mass index (BMI) 22.0-22.9, adult: Secondary | ICD-10-CM | POA: Diagnosis not present

## 2022-03-01 DIAGNOSIS — Z01419 Encounter for gynecological examination (general) (routine) without abnormal findings: Secondary | ICD-10-CM | POA: Diagnosis not present

## 2022-03-01 DIAGNOSIS — Z124 Encounter for screening for malignant neoplasm of cervix: Secondary | ICD-10-CM | POA: Diagnosis not present

## 2022-03-03 LAB — HM PAP SMEAR: HPV, high-risk: NEGATIVE

## 2022-03-08 DIAGNOSIS — F331 Major depressive disorder, recurrent, moderate: Secondary | ICD-10-CM | POA: Diagnosis not present

## 2022-03-08 DIAGNOSIS — F411 Generalized anxiety disorder: Secondary | ICD-10-CM | POA: Diagnosis not present

## 2022-03-22 DIAGNOSIS — F411 Generalized anxiety disorder: Secondary | ICD-10-CM | POA: Diagnosis not present

## 2022-03-22 DIAGNOSIS — F331 Major depressive disorder, recurrent, moderate: Secondary | ICD-10-CM | POA: Diagnosis not present

## 2022-03-29 ENCOUNTER — Other Ambulatory Visit (HOSPITAL_COMMUNITY): Payer: Self-pay

## 2022-03-29 DIAGNOSIS — E46 Unspecified protein-calorie malnutrition: Secondary | ICD-10-CM | POA: Diagnosis not present

## 2022-03-29 DIAGNOSIS — F902 Attention-deficit hyperactivity disorder, combined type: Secondary | ICD-10-CM | POA: Diagnosis not present

## 2022-03-29 DIAGNOSIS — R0602 Shortness of breath: Secondary | ICD-10-CM | POA: Diagnosis not present

## 2022-03-29 DIAGNOSIS — F411 Generalized anxiety disorder: Secondary | ICD-10-CM | POA: Diagnosis not present

## 2022-03-29 DIAGNOSIS — F322 Major depressive disorder, single episode, severe without psychotic features: Secondary | ICD-10-CM | POA: Diagnosis not present

## 2022-03-29 DIAGNOSIS — F419 Anxiety disorder, unspecified: Secondary | ICD-10-CM | POA: Diagnosis not present

## 2022-03-29 DIAGNOSIS — R5383 Other fatigue: Secondary | ICD-10-CM | POA: Diagnosis not present

## 2022-03-29 DIAGNOSIS — Z1389 Encounter for screening for other disorder: Secondary | ICD-10-CM | POA: Diagnosis not present

## 2022-03-29 MED ORDER — PROPRANOLOL HCL 20 MG PO TABS
20.0000 mg | ORAL_TABLET | Freq: Three times a day (TID) | ORAL | 0 refills | Status: DC | PRN
  Filled 2022-03-29: qty 90, 30d supply, fill #0

## 2022-04-05 DIAGNOSIS — F411 Generalized anxiety disorder: Secondary | ICD-10-CM | POA: Diagnosis not present

## 2022-04-05 DIAGNOSIS — F331 Major depressive disorder, recurrent, moderate: Secondary | ICD-10-CM | POA: Diagnosis not present

## 2022-04-06 ENCOUNTER — Other Ambulatory Visit (HOSPITAL_COMMUNITY): Payer: Self-pay

## 2022-04-10 ENCOUNTER — Emergency Department
Admission: EM | Admit: 2022-04-10 | Discharge: 2022-04-10 | Disposition: A | Discharge status: Home / Self Care | Payer: 59 | Source: Home / Self Care

## 2022-04-10 DIAGNOSIS — H6593 Unspecified nonsuppurative otitis media, bilateral: Secondary | ICD-10-CM

## 2022-04-10 DIAGNOSIS — J029 Acute pharyngitis, unspecified: Secondary | ICD-10-CM | POA: Diagnosis not present

## 2022-04-10 HISTORY — DX: Attention-deficit hyperactivity disorder, unspecified type: F90.9

## 2022-04-10 LAB — POCT RAPID STREP A (OFFICE): Rapid Strep A Screen: NEGATIVE

## 2022-04-10 MED ORDER — AZITHROMYCIN 250 MG PO TABS: 250.0000 mg | ORAL_TABLET | Freq: Every day | ORAL | 0 refills | Status: DC

## 2022-04-10 NOTE — ED Triage Notes (Signed)
Pt states that she has a sore throat, ear pain, body aches, cough and some chest congestion. X4 days  Pt states that she is vaccinated for covid. Pt states that she has had flu vaccine.  Pt states that she has had a negative at home covid test on 5/26.

## 2022-04-10 NOTE — Discharge Instructions (Addendum)
Continue antihistamine and add flonase and antibiotic for ear infection- strep was negative- culture is pending- you will be called with the results- rest and drink plenty of fluids

## 2022-04-10 NOTE — ED Provider Notes (Signed)
[facility]   MRN: [id number] DOB: 28-Apr-2001  Subjective:   Chief Complaint;  Chief Complaint  Patient presents with   Sore Throat    X4 days Sore throat, ear pain, body aches, cough and chest congestion.  Pt states that she has a sore throat, ear pain, body aches, cough and some chest congestion. X4 days   Pt states that she is vaccinated for covid. Pt states that she has had flu vaccine.   Pt states that she has had a negative at home covid test on 5/26.  Carmen Rodriguez is a 21 y.o. female presenting for 4 day hx of sinus pressure ear pain and sore throat- now having yellow dc from nose  No current facility-administered medications for this encounter.  Current Outpatient Medications:    azithromycin (ZITHROMAX) 250 MG tablet, Take 1 tablet (250 mg total) by mouth daily. Take first 2 tablets together, then 1 every day until finished., Disp: 6 tablet, Rfl: 0   Cetirizine HCl (ZYRTEC ALLERGY PO), Take by mouth., Disp: , Rfl:    MELATONIN CR PO, Take by mouth., Disp: , Rfl:    albuterol (VENTOLIN HFA) 108 (90 Base) MCG/ACT inhaler, INHALE 2 PUFF(S) BY MOUTH 4 TIMES A DAY, Disp: 8.5 g, Rfl: 11   ALPRAZolam (XANAX) 0.5 MG tablet, Take 1 tablet (0.5 mg total) by mouth 3 (three) times daily as needed for anxiety, Disp: 90 tablet, Rfl: 0   betamethasone dipropionate 0.05 % lotion, Apply 1 application to the scalp daily; Taper use as able, Disp: 120 mL, Rfl: 3   buPROPion ER (WELLBUTRIN SR) 100 MG 12 hr tablet, Take 1 tablet (100 mg total) by mouth daily., Disp: 90 tablet, Rfl: 0   Fexofenadine HCl (ALLEGRA PO), Take by mouth., Disp: , Rfl:    hydrocortisone 2.5 % cream, Apply 1 application on the skin nightly; Mix with ketoconazole for face-taper as able, Disp: 30 g, Rfl: 2   ketoconazole (NIZORAL) 2 % cream, Apply 1 application on the skin nightly; Mix with hydrocortisone for face-taper as able, Disp: 30 g, Rfl: 2   propranolol (INDERAL) 20 MG tablet, Take 1 tablet  (20 mg total) by mouth 3 (three) times daily as needed., Disp: 90 tablet, Rfl: 0   traZODone (DESYREL) 50 MG tablet, Take 1/2 to 1 tablet by mouth at night as needed for difficulty sleeping. Take 30 to 60 minutes before bed., Disp: 30 tablet, Rfl: 1   triamcinolone cream (KENALOG) 0.1 %, Apply to itchy areas on body one to two times daily as needed, Disp: 30 g, Rfl: 0   No Known Allergies  Past Medical History:  Diagnosis Date   ADHD    Allergy    Asthma      Review of Systems  All other systems reviewed and are negative.   Objective:   Vitals: BP 111/72 (BP Location: Left Arm)   Pulse 86   Temp 99.2 F (37.3 C) (Oral)   Resp 20   Ht '5\' 7"'$  (1.702 m)   Wt 138 lb (62.6 kg)   LMP 03/27/2022   SpO2 95%   BMI 21.61 kg/m   Physical Exam Vitals and nursing note reviewed.  Constitutional:      General: She is not in acute distress.    Appearance: She is well-developed.  HENT:     Head: Normocephalic and atraumatic.     Right Ear: A middle ear effusion is present. Tympanic membrane is erythematous.  Left Ear: A middle ear effusion is present. Tympanic membrane is erythematous.     Mouth/Throat:     Mouth: Mucous membranes are moist.     Pharynx: Posterior oropharyngeal erythema present. No pharyngeal swelling or oropharyngeal exudate.  Eyes:     Conjunctiva/sclera: Conjunctivae normal.  Cardiovascular:     Rate and Rhythm: Normal rate and regular rhythm.     Heart sounds: No murmur heard. Pulmonary:     Effort: Pulmonary effort is normal. No respiratory distress.     Breath sounds: Normal breath sounds.  Abdominal:     Palpations: Abdomen is soft.     Tenderness: There is no abdominal tenderness.  Musculoskeletal:        General: No swelling.     Cervical back: Neck supple.  Skin:    General: Skin is warm and dry.     Capillary Refill: Capillary refill takes less than 2 seconds.  Neurological:     Mental Status: She is alert.  Psychiatric:        Mood and  Affect: Mood normal.    Results for orders placed or performed during the hospital encounter of 04/10/22 (from the past 24 hour(s))  POCT rapid strep A     Status: None   Collection Time: 04/10/22  8:50 AM  Result Value Ref Range   Rapid Strep A Screen Negative Negative    No results found.     Assessment and Plan :   1. Sore throat   2. Bilateral non-suppurative otitis media     Meds ordered this encounter  Medications   azithromycin (ZITHROMAX) 250 MG tablet    Sig: Take 1 tablet (250 mg total) by mouth daily. Take first 2 tablets together, then 1 every day until finished.    Dispense:  6 tablet    Refill:  0    MDM:  Carmen Rodriguez is a 21 y.o. female presenting for viral type sx and recent ear pain bilat. Exam is consistent with bom rx for abx and continued work on decongestant with otc medication.she will fu with pcp if not improving or worse for reeval  Leida Lauth FNP-C MCN    Hezzie Bump, NP 04/10/22 [phone]

## 2022-04-13 DIAGNOSIS — E538 Deficiency of other specified B group vitamins: Secondary | ICD-10-CM | POA: Diagnosis not present

## 2022-04-13 DIAGNOSIS — F902 Attention-deficit hyperactivity disorder, combined type: Secondary | ICD-10-CM | POA: Diagnosis not present

## 2022-04-13 DIAGNOSIS — F339 Major depressive disorder, recurrent, unspecified: Secondary | ICD-10-CM | POA: Diagnosis not present

## 2022-04-13 DIAGNOSIS — F5089 Other specified eating disorder: Secondary | ICD-10-CM | POA: Diagnosis not present

## 2022-04-13 LAB — CULTURE, GROUP A STREP: Strep A Culture: NEGATIVE

## 2022-04-19 DIAGNOSIS — F411 Generalized anxiety disorder: Secondary | ICD-10-CM | POA: Diagnosis not present

## 2022-04-19 DIAGNOSIS — F331 Major depressive disorder, recurrent, moderate: Secondary | ICD-10-CM | POA: Diagnosis not present

## 2022-04-19 DIAGNOSIS — E538 Deficiency of other specified B group vitamins: Secondary | ICD-10-CM | POA: Diagnosis not present

## 2022-04-20 DIAGNOSIS — E538 Deficiency of other specified B group vitamins: Secondary | ICD-10-CM | POA: Diagnosis not present

## 2022-04-27 ENCOUNTER — Other Ambulatory Visit (HOSPITAL_COMMUNITY): Payer: Self-pay

## 2022-04-27 DIAGNOSIS — D519 Vitamin B12 deficiency anemia, unspecified: Secondary | ICD-10-CM | POA: Diagnosis not present

## 2022-04-27 DIAGNOSIS — F322 Major depressive disorder, single episode, severe without psychotic features: Secondary | ICD-10-CM | POA: Diagnosis not present

## 2022-04-27 DIAGNOSIS — F410 Panic disorder [episodic paroxysmal anxiety] without agoraphobia: Secondary | ICD-10-CM | POA: Diagnosis not present

## 2022-04-27 MED ORDER — PROPRANOLOL HCL 20 MG PO TABS
20.0000 mg | ORAL_TABLET | Freq: Three times a day (TID) | ORAL | 0 refills | Status: DC | PRN
  Filled 2022-04-27: qty 40, 14d supply, fill #0

## 2022-04-28 ENCOUNTER — Other Ambulatory Visit (HOSPITAL_COMMUNITY): Payer: Self-pay

## 2022-04-28 MED ORDER — LAMOTRIGINE 25 MG PO TABS
25.0000 mg | ORAL_TABLET | Freq: Every day | ORAL | 1 refills | Status: DC
  Filled 2022-04-28: qty 30, 30d supply, fill #0
  Filled 2022-06-09: qty 30, 30d supply, fill #1

## 2022-05-03 DIAGNOSIS — F331 Major depressive disorder, recurrent, moderate: Secondary | ICD-10-CM | POA: Diagnosis not present

## 2022-05-03 DIAGNOSIS — F411 Generalized anxiety disorder: Secondary | ICD-10-CM | POA: Diagnosis not present

## 2022-05-17 DIAGNOSIS — F3289 Other specified depressive episodes: Secondary | ICD-10-CM | POA: Diagnosis not present

## 2022-05-17 DIAGNOSIS — D519 Vitamin B12 deficiency anemia, unspecified: Secondary | ICD-10-CM | POA: Diagnosis not present

## 2022-05-17 DIAGNOSIS — F411 Generalized anxiety disorder: Secondary | ICD-10-CM | POA: Diagnosis not present

## 2022-05-17 DIAGNOSIS — Z6822 Body mass index (BMI) 22.0-22.9, adult: Secondary | ICD-10-CM | POA: Diagnosis not present

## 2022-05-17 DIAGNOSIS — F410 Panic disorder [episodic paroxysmal anxiety] without agoraphobia: Secondary | ICD-10-CM | POA: Diagnosis not present

## 2022-05-17 DIAGNOSIS — F331 Major depressive disorder, recurrent, moderate: Secondary | ICD-10-CM | POA: Diagnosis not present

## 2022-05-31 DIAGNOSIS — E538 Deficiency of other specified B group vitamins: Secondary | ICD-10-CM | POA: Diagnosis not present

## 2022-05-31 DIAGNOSIS — F322 Major depressive disorder, single episode, severe without psychotic features: Secondary | ICD-10-CM | POA: Diagnosis not present

## 2022-05-31 DIAGNOSIS — Z6821 Body mass index (BMI) 21.0-21.9, adult: Secondary | ICD-10-CM | POA: Diagnosis not present

## 2022-05-31 DIAGNOSIS — F411 Generalized anxiety disorder: Secondary | ICD-10-CM | POA: Diagnosis not present

## 2022-05-31 DIAGNOSIS — F410 Panic disorder [episodic paroxysmal anxiety] without agoraphobia: Secondary | ICD-10-CM | POA: Diagnosis not present

## 2022-05-31 DIAGNOSIS — F331 Major depressive disorder, recurrent, moderate: Secondary | ICD-10-CM | POA: Diagnosis not present

## 2022-06-09 ENCOUNTER — Other Ambulatory Visit (HOSPITAL_COMMUNITY): Payer: Self-pay

## 2022-06-14 DIAGNOSIS — F411 Generalized anxiety disorder: Secondary | ICD-10-CM | POA: Diagnosis not present

## 2022-06-14 DIAGNOSIS — F331 Major depressive disorder, recurrent, moderate: Secondary | ICD-10-CM | POA: Diagnosis not present

## 2022-06-25 DIAGNOSIS — F331 Major depressive disorder, recurrent, moderate: Secondary | ICD-10-CM | POA: Diagnosis not present

## 2022-06-25 DIAGNOSIS — F411 Generalized anxiety disorder: Secondary | ICD-10-CM | POA: Diagnosis not present

## 2022-07-05 DIAGNOSIS — F339 Major depressive disorder, recurrent, unspecified: Secondary | ICD-10-CM | POA: Diagnosis not present

## 2022-07-05 DIAGNOSIS — E538 Deficiency of other specified B group vitamins: Secondary | ICD-10-CM | POA: Diagnosis not present

## 2022-07-09 DIAGNOSIS — F411 Generalized anxiety disorder: Secondary | ICD-10-CM | POA: Diagnosis not present

## 2022-07-09 DIAGNOSIS — F331 Major depressive disorder, recurrent, moderate: Secondary | ICD-10-CM | POA: Diagnosis not present

## 2022-07-14 ENCOUNTER — Other Ambulatory Visit (HOSPITAL_COMMUNITY): Payer: Self-pay

## 2022-07-15 ENCOUNTER — Other Ambulatory Visit (HOSPITAL_COMMUNITY): Payer: Self-pay

## 2022-07-16 ENCOUNTER — Other Ambulatory Visit (HOSPITAL_COMMUNITY): Payer: Self-pay

## 2022-07-16 MED ORDER — LAMOTRIGINE 25 MG PO TABS
50.0000 mg | ORAL_TABLET | Freq: Every day | ORAL | 2 refills | Status: DC
  Filled 2022-07-16: qty 28, 14d supply, fill #0
  Filled 2022-07-16: qty 32, 16d supply, fill #0
  Filled 2022-08-23: qty 60, 30d supply, fill #1

## 2022-07-23 DIAGNOSIS — F331 Major depressive disorder, recurrent, moderate: Secondary | ICD-10-CM | POA: Diagnosis not present

## 2022-07-23 DIAGNOSIS — F411 Generalized anxiety disorder: Secondary | ICD-10-CM | POA: Diagnosis not present

## 2022-08-02 ENCOUNTER — Other Ambulatory Visit (HOSPITAL_COMMUNITY): Payer: Self-pay

## 2022-08-02 DIAGNOSIS — F339 Major depressive disorder, recurrent, unspecified: Secondary | ICD-10-CM | POA: Diagnosis not present

## 2022-08-02 DIAGNOSIS — F411 Generalized anxiety disorder: Secondary | ICD-10-CM | POA: Diagnosis not present

## 2022-08-02 DIAGNOSIS — D518 Other vitamin B12 deficiency anemias: Secondary | ICD-10-CM | POA: Diagnosis not present

## 2022-08-02 DIAGNOSIS — F902 Attention-deficit hyperactivity disorder, combined type: Secondary | ICD-10-CM | POA: Diagnosis not present

## 2022-08-02 MED ORDER — CYANOCOBALAMIN 1000 MCG/ML IJ SOLN
1.0000 mg | INTRAMUSCULAR | 0 refills | Status: DC
  Filled 2022-08-02 – 2022-08-23 (×2): qty 4, 28d supply, fill #0

## 2022-08-02 MED ORDER — "LUER LOCK SAFETY SYRINGES 25G X 1"" 3 ML MISC"
0 refills | Status: AC
  Filled 2022-08-02 – 2022-08-23 (×2): qty 4, 28d supply, fill #0

## 2022-08-06 DIAGNOSIS — F331 Major depressive disorder, recurrent, moderate: Secondary | ICD-10-CM | POA: Diagnosis not present

## 2022-08-06 DIAGNOSIS — F411 Generalized anxiety disorder: Secondary | ICD-10-CM | POA: Diagnosis not present

## 2022-08-10 ENCOUNTER — Other Ambulatory Visit (HOSPITAL_COMMUNITY): Payer: Self-pay

## 2022-08-20 DIAGNOSIS — F411 Generalized anxiety disorder: Secondary | ICD-10-CM | POA: Diagnosis not present

## 2022-08-20 DIAGNOSIS — F331 Major depressive disorder, recurrent, moderate: Secondary | ICD-10-CM | POA: Diagnosis not present

## 2022-08-24 ENCOUNTER — Other Ambulatory Visit (HOSPITAL_COMMUNITY): Payer: Self-pay

## 2022-08-24 DIAGNOSIS — F331 Major depressive disorder, recurrent, moderate: Secondary | ICD-10-CM | POA: Diagnosis not present

## 2022-08-24 DIAGNOSIS — F411 Generalized anxiety disorder: Secondary | ICD-10-CM | POA: Diagnosis not present

## 2022-08-31 DIAGNOSIS — F411 Generalized anxiety disorder: Secondary | ICD-10-CM | POA: Diagnosis not present

## 2022-08-31 DIAGNOSIS — F331 Major depressive disorder, recurrent, moderate: Secondary | ICD-10-CM | POA: Diagnosis not present

## 2022-09-02 DIAGNOSIS — E538 Deficiency of other specified B group vitamins: Secondary | ICD-10-CM | POA: Diagnosis not present

## 2022-09-02 DIAGNOSIS — F411 Generalized anxiety disorder: Secondary | ICD-10-CM | POA: Diagnosis not present

## 2022-09-02 DIAGNOSIS — F339 Major depressive disorder, recurrent, unspecified: Secondary | ICD-10-CM | POA: Diagnosis not present

## 2022-09-03 ENCOUNTER — Other Ambulatory Visit (HOSPITAL_COMMUNITY): Payer: Self-pay

## 2022-09-03 DIAGNOSIS — F411 Generalized anxiety disorder: Secondary | ICD-10-CM | POA: Diagnosis not present

## 2022-09-03 DIAGNOSIS — F331 Major depressive disorder, recurrent, moderate: Secondary | ICD-10-CM | POA: Diagnosis not present

## 2022-09-03 MED ORDER — LAMOTRIGINE 25 MG PO TABS
75.0000 mg | ORAL_TABLET | Freq: Every day | ORAL | 3 refills | Status: DC
  Filled 2022-09-03 – 2022-09-17 (×2): qty 90, 30d supply, fill #0

## 2022-09-07 DIAGNOSIS — F411 Generalized anxiety disorder: Secondary | ICD-10-CM | POA: Diagnosis not present

## 2022-09-07 DIAGNOSIS — F331 Major depressive disorder, recurrent, moderate: Secondary | ICD-10-CM | POA: Diagnosis not present

## 2022-09-14 DIAGNOSIS — F411 Generalized anxiety disorder: Secondary | ICD-10-CM | POA: Diagnosis not present

## 2022-09-14 DIAGNOSIS — F331 Major depressive disorder, recurrent, moderate: Secondary | ICD-10-CM | POA: Diagnosis not present

## 2022-09-17 ENCOUNTER — Other Ambulatory Visit (HOSPITAL_COMMUNITY): Payer: Self-pay

## 2022-09-17 DIAGNOSIS — F331 Major depressive disorder, recurrent, moderate: Secondary | ICD-10-CM | POA: Diagnosis not present

## 2022-09-17 DIAGNOSIS — F411 Generalized anxiety disorder: Secondary | ICD-10-CM | POA: Diagnosis not present

## 2022-09-21 DIAGNOSIS — F331 Major depressive disorder, recurrent, moderate: Secondary | ICD-10-CM | POA: Diagnosis not present

## 2022-09-21 DIAGNOSIS — F411 Generalized anxiety disorder: Secondary | ICD-10-CM | POA: Diagnosis not present

## 2022-09-28 DIAGNOSIS — F331 Major depressive disorder, recurrent, moderate: Secondary | ICD-10-CM | POA: Diagnosis not present

## 2022-09-28 DIAGNOSIS — F411 Generalized anxiety disorder: Secondary | ICD-10-CM | POA: Diagnosis not present

## 2022-10-01 DIAGNOSIS — F411 Generalized anxiety disorder: Secondary | ICD-10-CM | POA: Diagnosis not present

## 2022-10-01 DIAGNOSIS — F331 Major depressive disorder, recurrent, moderate: Secondary | ICD-10-CM | POA: Diagnosis not present

## 2022-10-05 DIAGNOSIS — F411 Generalized anxiety disorder: Secondary | ICD-10-CM | POA: Diagnosis not present

## 2022-10-05 DIAGNOSIS — F331 Major depressive disorder, recurrent, moderate: Secondary | ICD-10-CM | POA: Diagnosis not present

## 2022-10-12 DIAGNOSIS — F411 Generalized anxiety disorder: Secondary | ICD-10-CM | POA: Diagnosis not present

## 2022-10-12 DIAGNOSIS — F331 Major depressive disorder, recurrent, moderate: Secondary | ICD-10-CM | POA: Diagnosis not present

## 2022-10-15 DIAGNOSIS — F411 Generalized anxiety disorder: Secondary | ICD-10-CM | POA: Diagnosis not present

## 2022-10-15 DIAGNOSIS — F331 Major depressive disorder, recurrent, moderate: Secondary | ICD-10-CM | POA: Diagnosis not present

## 2022-10-18 DIAGNOSIS — Z6822 Body mass index (BMI) 22.0-22.9, adult: Secondary | ICD-10-CM | POA: Diagnosis not present

## 2022-10-18 DIAGNOSIS — F411 Generalized anxiety disorder: Secondary | ICD-10-CM | POA: Diagnosis not present

## 2022-10-18 DIAGNOSIS — D513 Other dietary vitamin B12 deficiency anemia: Secondary | ICD-10-CM | POA: Diagnosis not present

## 2022-10-19 ENCOUNTER — Other Ambulatory Visit (HOSPITAL_COMMUNITY): Payer: Self-pay

## 2022-10-19 DIAGNOSIS — F411 Generalized anxiety disorder: Secondary | ICD-10-CM | POA: Diagnosis not present

## 2022-10-19 DIAGNOSIS — F331 Major depressive disorder, recurrent, moderate: Secondary | ICD-10-CM | POA: Diagnosis not present

## 2022-10-19 MED ORDER — LAMOTRIGINE 25 MG PO TABS
50.0000 mg | ORAL_TABLET | Freq: Every day | ORAL | 1 refills | Status: DC
  Filled 2022-10-19: qty 180, 90d supply, fill #0

## 2022-10-26 DIAGNOSIS — F331 Major depressive disorder, recurrent, moderate: Secondary | ICD-10-CM | POA: Diagnosis not present

## 2022-10-26 DIAGNOSIS — F411 Generalized anxiety disorder: Secondary | ICD-10-CM | POA: Diagnosis not present

## 2022-11-02 DIAGNOSIS — F331 Major depressive disorder, recurrent, moderate: Secondary | ICD-10-CM | POA: Diagnosis not present

## 2022-11-02 DIAGNOSIS — F411 Generalized anxiety disorder: Secondary | ICD-10-CM | POA: Diagnosis not present

## 2022-11-09 DIAGNOSIS — F331 Major depressive disorder, recurrent, moderate: Secondary | ICD-10-CM | POA: Diagnosis not present

## 2022-11-09 DIAGNOSIS — F411 Generalized anxiety disorder: Secondary | ICD-10-CM | POA: Diagnosis not present

## 2022-11-12 DIAGNOSIS — F331 Major depressive disorder, recurrent, moderate: Secondary | ICD-10-CM | POA: Diagnosis not present

## 2022-11-12 DIAGNOSIS — F411 Generalized anxiety disorder: Secondary | ICD-10-CM | POA: Diagnosis not present

## 2022-11-16 DIAGNOSIS — F411 Generalized anxiety disorder: Secondary | ICD-10-CM | POA: Diagnosis not present

## 2022-11-16 DIAGNOSIS — F331 Major depressive disorder, recurrent, moderate: Secondary | ICD-10-CM | POA: Diagnosis not present

## 2022-11-23 DIAGNOSIS — F331 Major depressive disorder, recurrent, moderate: Secondary | ICD-10-CM | POA: Diagnosis not present

## 2022-11-23 DIAGNOSIS — F411 Generalized anxiety disorder: Secondary | ICD-10-CM | POA: Diagnosis not present

## 2022-11-26 DIAGNOSIS — F331 Major depressive disorder, recurrent, moderate: Secondary | ICD-10-CM | POA: Diagnosis not present

## 2022-11-26 DIAGNOSIS — F411 Generalized anxiety disorder: Secondary | ICD-10-CM | POA: Diagnosis not present

## 2022-11-30 DIAGNOSIS — F331 Major depressive disorder, recurrent, moderate: Secondary | ICD-10-CM | POA: Diagnosis not present

## 2022-11-30 DIAGNOSIS — F411 Generalized anxiety disorder: Secondary | ICD-10-CM | POA: Diagnosis not present

## 2022-12-02 ENCOUNTER — Other Ambulatory Visit (HOSPITAL_COMMUNITY): Payer: Self-pay

## 2022-12-02 DIAGNOSIS — D513 Other dietary vitamin B12 deficiency anemia: Secondary | ICD-10-CM | POA: Diagnosis not present

## 2022-12-02 DIAGNOSIS — F411 Generalized anxiety disorder: Secondary | ICD-10-CM | POA: Diagnosis not present

## 2022-12-02 DIAGNOSIS — F902 Attention-deficit hyperactivity disorder, combined type: Secondary | ICD-10-CM | POA: Diagnosis not present

## 2022-12-02 DIAGNOSIS — F339 Major depressive disorder, recurrent, unspecified: Secondary | ICD-10-CM | POA: Diagnosis not present

## 2022-12-02 MED ORDER — LAMOTRIGINE 25 MG PO TABS
25.0000 mg | ORAL_TABLET | Freq: Every day | ORAL | 1 refills | Status: DC
  Filled 2022-12-02 – 2023-01-05 (×3): qty 90, 90d supply, fill #0

## 2022-12-02 MED ORDER — SERTRALINE HCL 25 MG PO TABS
25.0000 mg | ORAL_TABLET | Freq: Every day | ORAL | 2 refills | Status: DC
  Filled 2022-12-02: qty 30, 30d supply, fill #0
  Filled 2023-01-04: qty 30, 30d supply, fill #1
  Filled 2023-02-21: qty 30, 30d supply, fill #2

## 2022-12-03 ENCOUNTER — Other Ambulatory Visit (HOSPITAL_COMMUNITY): Payer: Self-pay

## 2022-12-06 ENCOUNTER — Other Ambulatory Visit (HOSPITAL_COMMUNITY): Payer: Self-pay

## 2022-12-07 DIAGNOSIS — F411 Generalized anxiety disorder: Secondary | ICD-10-CM | POA: Diagnosis not present

## 2022-12-07 DIAGNOSIS — F331 Major depressive disorder, recurrent, moderate: Secondary | ICD-10-CM | POA: Diagnosis not present

## 2022-12-10 DIAGNOSIS — F411 Generalized anxiety disorder: Secondary | ICD-10-CM | POA: Diagnosis not present

## 2022-12-10 DIAGNOSIS — F331 Major depressive disorder, recurrent, moderate: Secondary | ICD-10-CM | POA: Diagnosis not present

## 2022-12-13 DIAGNOSIS — F331 Major depressive disorder, recurrent, moderate: Secondary | ICD-10-CM | POA: Diagnosis not present

## 2022-12-13 DIAGNOSIS — F411 Generalized anxiety disorder: Secondary | ICD-10-CM | POA: Diagnosis not present

## 2022-12-20 ENCOUNTER — Ambulatory Visit: Payer: 59 | Admitting: Family Medicine

## 2022-12-20 ENCOUNTER — Encounter: Payer: Self-pay | Admitting: Family Medicine

## 2022-12-20 VITALS — BP 120/60 | HR 64 | Temp 98.3°F | Ht 67.0 in | Wt 150.5 lb

## 2022-12-20 DIAGNOSIS — Z0000 Encounter for general adult medical examination without abnormal findings: Secondary | ICD-10-CM

## 2022-12-20 NOTE — Progress Notes (Signed)
New Patient Office Visit  Subjective:  Patient ID: Carmen Rodriguez, female    DOB: 2001-03-19  Age: 22 y.o. MRN: [id number]  CC:  Chief Complaint  Patient presents with   Establish Care    Need new pcp Fasting     HPI Carmen Rodriguez presents for new pt-ins change  Migraine-aura very blurry vision and lights prior to HA.  Not so bad past few yrs.  Every few months.  Sumatriptan works well.  Dep/anx/ADHD-poss autism-so undergoing testing.  Screening was suggestive.  On meds.  Rx by Dr. [name] started zoloft 2-3 wks ago. No SI.  Wellbutrin in past.  Did labs w/[name]. Wellness-annual  Past Medical History:  Diagnosis Date   ADHD    Allergy    Anxiety    Asthma    EIA   Depression    Migraine aura occurring with and without headache    lights and blurry vision    Past Surgical History:  Procedure Laterality Date   MOUTH SURGERY  2013   k-9   WISDOM TOOTH EXTRACTION Bilateral 2017    Family History  Problem Relation Age of Onset   Depression Mother    Healthy Mother    Hypertension Father    Hyperlipidemia Father    Diabetes Father    Asthma Father    Arthritis Father    Alcohol abuse Father    Healthy Father    Depression Sister    Asthma Sister    Cancer Maternal Grandmother    Heart disease Maternal Grandfather    Hypertension Paternal Grandmother    Hearing loss Paternal Grandfather    Hypertension Paternal Grandfather    Heart disease Paternal Grandfather    Heart attack Paternal Grandfather     Social History   Socioeconomic History   Marital status: Single    Spouse name: Not on file   Number of children: 0   Years of education: Not on file   Highest education level: Bachelor's degree (e.g., BA, AB, BS)  Occupational History   Not on file  Tobacco Use   Smoking status: Former    Types: E-cigarettes    Quit date: 2023    Years since quitting: 1.0    Passive exposure: Never   Smokeless tobacco: Never  Vaping  Use   Vaping Use: Former   Quit date: 12/20/2022  Substance and Sexual Activity   Alcohol use: Yes    Comment: 1-3/wk   Drug use: Not Currently    Types: Marijuana   Sexual activity: Yes    Birth control/protection: None  Other Topics Concern   Not on file  Social History Narrative   Office   psychology   Social Determinants of Health   Financial Resource Strain: Not on file  Food Insecurity: Not on file  Transportation Needs: Not on file  Physical Activity: Not on file  Stress: Not on file  Social Connections: Not on file  Intimate Partner Violence: Not on file    ROS  ROS: Gen: no fever, chills  Skin: no rash, itching ENT: no ear pain, ear drainage, nasal congestion, rhinorrhea, sinus pressure, sore throat Eyes: no blurry vision, double vision Resp: no cough, wheeze,SOB CV: no CP, palpitations, LE edema,  GI: no heartburn, n/v/d/c, abd pain GU: no dysuria, urgency, frequency, hematuria   menses reg.  Pap 02/2022-gyn. MSK: no joint pain, myalgias, back pain Neuro: no dizziness, weakness, vertigo Psych: HPI  Objective:   Today's Vitals: BP  120/60   Pulse 64   Temp 98.3 F (36.8 C) (Temporal)   Ht '5\' 7"'$  (1.702 m)   Wt 150 lb 8 oz (68.3 kg)   LMP 12/20/2022 (Exact Date)   SpO2 98%   BMI 23.57 kg/m   Physical Exam  Gen: WDWN NAD HEENT: NCAT, conjunctiva not injected, sclera nonicteric NECK:  supple, no thyromegaly, no nodes, no carotid bruits CARDIAC: RRR, S1S2+, no murmur. DP 2+B LUNGS: CTAB. No wheezes ABDOMEN:  BS+, soft, NTND, No HSM, no masses EXT:  no edema MSK: no gross abnormalities.  NEURO: A&O x3.  CN II-XII intact.  PSYCH: normal mood. Good eye contact   Assessment & Plan:   Problem List Items Addressed This Visit   None Visit Diagnoses     Wellness examination    -  Primary      [facility] guidance.  Check on date of Tdap.  Had pap w/gyn in April(see note but not result).   Depression/anx/ADHD-managed by psych and Dr.  [name] Migraine - every few mo-chronic   imitrex 31 works well.  No refill needed.  F/u annual  Outpatient Encounter Medications as of 12/20/2022  Medication Sig   betamethasone dipropionate 0.05 % lotion Apply 1 application to the scalp daily; Taper use as able   Cetirizine HCl (ZYRTEC ALLERGY PO) Take by mouth.   hydrocortisone 2.5 % cream Apply 1 application on the skin nightly; Mix with ketoconazole for face-taper as able   ketoconazole (NIZORAL) 2 % cream Apply 1 application on the skin nightly; Mix with hydrocortisone for face-taper as able   lamoTRIgine (LAMICTAL) 25 MG tablet Take 1 tablet (25 mg total) by mouth daily.   MELATONIN CR PO Take by mouth.   polyethylene glycol powder (MIRALAX) 17 GM/SCOOP powder Miralax 17 gram/dose oral powder  Take by oral route.   propranolol (INDERAL) 20 MG tablet Take 1 tablet (20 mg total) by mouth 3 (three) times daily as needed for panic attacks   sertraline (ZOLOFT) 25 MG tablet Take 1 tablet (25 mg total) by mouth at bedtime.   SUMAtriptan (IMITREX) 25 MG tablet Take by mouth.   traZODone (DESYREL) 50 MG tablet Take 1/2 to 1 tablet by mouth at night as needed for difficulty sleeping. Take 30 to 60 minutes before bed.   triamcinolone cream (KENALOG) 0.1 % Apply to itchy areas on body one to two times daily as needed   cyanocobalamin (VITAMIN B12) 1000 MCG/ML injection Inject 1 mL (1,000 mcg total) once a week as directed (Patient not taking: Reported on 12/20/2022)   SYRINGE-NEEDLE, DISP, 3 ML (LUER LOCK SAFETY SYRINGES) 25G X 1" 3 ML MISC Use with Vitamin B-12 intramuscular injection once a week (Patient not taking: Reported on 12/20/2022)   [DISCONTINUED] albuterol (VENTOLIN HFA) 108 (90 Base) MCG/ACT inhaler INHALE 2 PUFF(S) BY MOUTH 4 TIMES A DAY   [DISCONTINUED] ALPRAZolam (XANAX) 0.5 MG tablet Take 1 tablet (0.5 mg total) by mouth 3 (three) times daily as needed for anxiety   [DISCONTINUED] azithromycin (ZITHROMAX) 250 MG tablet Take 1  tablet (250 mg total) by mouth daily. Take first 2 tablets together, then 1 every day until finished.   [DISCONTINUED] buPROPion ER (WELLBUTRIN SR) 100 MG 12 hr tablet Take 1 tablet (100 mg total) by mouth daily.   [DISCONTINUED] Fexofenadine HCl (ALLEGRA PO) Take by mouth.   [DISCONTINUED] lamoTRIgine (LAMICTAL) 25 MG tablet Take 2 tablets (50 mg total) by mouth daily.   [DISCONTINUED] lamoTRIgine (LAMICTAL) 25 MG tablet Take 3  tablets (75 mg total) by mouth daily.   [DISCONTINUED] lamoTRIgine (LAMICTAL) 25 MG tablet Take 2 tablets (50 mg total) by mouth daily.   [DISCONTINUED] propranolol (INDERAL) 20 MG tablet Take 1 tablet (20 mg total) by mouth 3 (three) times daily as needed.   No facility-administered encounter medications on file as of 12/20/2022.    Follow-up: Return in about 1 year (around 12/21/2023) for annual.   Wellington Hampshire, MD

## 2022-12-20 NOTE — Patient Instructions (Addendum)
Welcome to [facility]! It was a pleasure meeting you today.  As discussed, Please schedule a 12 month follow up visit today.  Check on dates of tetanus  PLEASE NOTE:  If you had any LAB tests please let us know if you have not heard back within a few days. You may see your results on MyChart before we have a chance to review them but we will give you a call once they are reviewed by [place]. If we ordered any REFERRALS today, please let us know if you have not heard from their office within the next week.  Let us know through MyChart if you are needing REFILLS, or have your pharmacy send [place] the request. You can also use MyChart to communicate with me or any office staff.  Please try these tips to maintain a healthy lifestyle:  Eat most of your calories during the day when you are active. Eliminate processed foods including packaged sweets (pies, cakes, cookies), reduce intake of potatoes, white bread, white pasta, and white rice. Look for whole grain options, oat flour or almond flour.  Each meal should contain half fruits/vegetables, one quarter protein, and one quarter carbs (no bigger than a computer mouse).  Cut down on sweet beverages. This includes juice, soda, and sweet tea. Also watch fruit intake, though this is a healthier sweet option, it still contains natural sugar! Limit to 3 servings daily.  Drink at least 1 glass of water with each meal and aim for at least 8 glasses per day  Exercise at least 150 minutes every week.

## 2022-12-24 DIAGNOSIS — F331 Major depressive disorder, recurrent, moderate: Secondary | ICD-10-CM | POA: Diagnosis not present

## 2022-12-24 DIAGNOSIS — F411 Generalized anxiety disorder: Secondary | ICD-10-CM | POA: Diagnosis not present

## 2023-01-03 DIAGNOSIS — F331 Major depressive disorder, recurrent, moderate: Secondary | ICD-10-CM | POA: Diagnosis not present

## 2023-01-03 DIAGNOSIS — F411 Generalized anxiety disorder: Secondary | ICD-10-CM | POA: Diagnosis not present

## 2023-01-04 ENCOUNTER — Other Ambulatory Visit (HOSPITAL_COMMUNITY): Payer: Self-pay

## 2023-01-05 ENCOUNTER — Other Ambulatory Visit (HOSPITAL_COMMUNITY): Payer: Self-pay

## 2023-01-05 ENCOUNTER — Other Ambulatory Visit: Payer: Self-pay

## 2023-01-05 DIAGNOSIS — F411 Generalized anxiety disorder: Secondary | ICD-10-CM | POA: Diagnosis not present

## 2023-01-05 DIAGNOSIS — E538 Deficiency of other specified B group vitamins: Secondary | ICD-10-CM | POA: Diagnosis not present

## 2023-01-05 DIAGNOSIS — Z6822 Body mass index (BMI) 22.0-22.9, adult: Secondary | ICD-10-CM | POA: Diagnosis not present

## 2023-01-06 ENCOUNTER — Other Ambulatory Visit (HOSPITAL_COMMUNITY): Payer: Self-pay

## 2023-01-06 MED ORDER — SERTRALINE HCL 100 MG PO TABS
100.0000 mg | ORAL_TABLET | Freq: Every day | ORAL | 0 refills | Status: DC
  Filled 2023-01-06 – 2023-03-07 (×2): qty 30, 30d supply, fill #0

## 2023-01-06 MED ORDER — SERTRALINE HCL 50 MG PO TABS
50.0000 mg | ORAL_TABLET | Freq: Every day | ORAL | 0 refills | Status: DC
  Filled 2023-01-06: qty 11, 11d supply, fill #0
  Filled 2023-01-06: qty 19, 19d supply, fill #0
  Filled 2023-01-06 (×3): qty 30, 30d supply, fill #0
  Filled 2023-01-06: qty 19, 19d supply, fill #0

## 2023-01-21 DIAGNOSIS — F411 Generalized anxiety disorder: Secondary | ICD-10-CM | POA: Diagnosis not present

## 2023-01-21 DIAGNOSIS — F331 Major depressive disorder, recurrent, moderate: Secondary | ICD-10-CM | POA: Diagnosis not present

## 2023-02-15 DIAGNOSIS — F411 Generalized anxiety disorder: Secondary | ICD-10-CM | POA: Diagnosis not present

## 2023-02-15 DIAGNOSIS — F902 Attention-deficit hyperactivity disorder, combined type: Secondary | ICD-10-CM | POA: Diagnosis not present

## 2023-02-15 DIAGNOSIS — E538 Deficiency of other specified B group vitamins: Secondary | ICD-10-CM | POA: Diagnosis not present

## 2023-02-15 DIAGNOSIS — F4522 Body dysmorphic disorder: Secondary | ICD-10-CM | POA: Diagnosis not present

## 2023-02-15 DIAGNOSIS — Z6823 Body mass index (BMI) 23.0-23.9, adult: Secondary | ICD-10-CM | POA: Diagnosis not present

## 2023-02-15 DIAGNOSIS — F339 Major depressive disorder, recurrent, unspecified: Secondary | ICD-10-CM | POA: Diagnosis not present

## 2023-02-15 DIAGNOSIS — F840 Autistic disorder: Secondary | ICD-10-CM | POA: Diagnosis not present

## 2023-02-15 DIAGNOSIS — F941 Reactive attachment disorder of childhood: Secondary | ICD-10-CM | POA: Diagnosis not present

## 2023-02-18 DIAGNOSIS — F411 Generalized anxiety disorder: Secondary | ICD-10-CM | POA: Diagnosis not present

## 2023-02-18 DIAGNOSIS — F331 Major depressive disorder, recurrent, moderate: Secondary | ICD-10-CM | POA: Diagnosis not present

## 2023-02-22 ENCOUNTER — Other Ambulatory Visit (HOSPITAL_COMMUNITY): Payer: Self-pay

## 2023-02-22 ENCOUNTER — Other Ambulatory Visit: Payer: Self-pay

## 2023-03-03 DIAGNOSIS — Z6823 Body mass index (BMI) 23.0-23.9, adult: Secondary | ICD-10-CM | POA: Diagnosis not present

## 2023-03-03 DIAGNOSIS — Z01419 Encounter for gynecological examination (general) (routine) without abnormal findings: Secondary | ICD-10-CM | POA: Diagnosis not present

## 2023-03-04 DIAGNOSIS — F411 Generalized anxiety disorder: Secondary | ICD-10-CM | POA: Diagnosis not present

## 2023-03-04 DIAGNOSIS — F331 Major depressive disorder, recurrent, moderate: Secondary | ICD-10-CM | POA: Diagnosis not present

## 2023-03-07 ENCOUNTER — Other Ambulatory Visit (HOSPITAL_COMMUNITY): Payer: Self-pay

## 2023-03-28 ENCOUNTER — Other Ambulatory Visit: Payer: Self-pay

## 2023-03-28 ENCOUNTER — Other Ambulatory Visit (HOSPITAL_COMMUNITY): Payer: Self-pay

## 2023-03-28 MED ORDER — SERTRALINE HCL 100 MG PO TABS
50.0000 mg | ORAL_TABLET | Freq: Every day | ORAL | 3 refills | Status: DC
  Filled 2023-03-28: qty 45, 90d supply, fill #0

## 2023-03-28 MED ORDER — PROPRANOLOL HCL 20 MG PO TABS
20.0000 mg | ORAL_TABLET | Freq: Three times a day (TID) | ORAL | 1 refills | Status: AC | PRN
  Filled 2023-03-28 (×2): qty 270, 90d supply, fill #0

## 2023-03-28 MED ORDER — LAMOTRIGINE 25 MG PO TABS
25.0000 mg | ORAL_TABLET | Freq: Every day | ORAL | 3 refills | Status: DC
  Filled 2023-03-28 (×2): qty 90, 90d supply, fill #0

## 2023-03-29 DIAGNOSIS — F902 Attention-deficit hyperactivity disorder, combined type: Secondary | ICD-10-CM | POA: Diagnosis not present

## 2023-03-29 DIAGNOSIS — E538 Deficiency of other specified B group vitamins: Secondary | ICD-10-CM | POA: Diagnosis not present

## 2023-03-29 DIAGNOSIS — Z6823 Body mass index (BMI) 23.0-23.9, adult: Secondary | ICD-10-CM | POA: Diagnosis not present

## 2023-03-29 DIAGNOSIS — F411 Generalized anxiety disorder: Secondary | ICD-10-CM | POA: Diagnosis not present

## 2023-03-29 DIAGNOSIS — J4520 Mild intermittent asthma, uncomplicated: Secondary | ICD-10-CM | POA: Diagnosis not present

## 2023-03-29 DIAGNOSIS — F410 Panic disorder [episodic paroxysmal anxiety] without agoraphobia: Secondary | ICD-10-CM | POA: Diagnosis not present

## 2023-03-30 ENCOUNTER — Other Ambulatory Visit: Payer: Self-pay

## 2023-03-30 ENCOUNTER — Other Ambulatory Visit (HOSPITAL_COMMUNITY): Payer: Self-pay

## 2023-03-30 MED ORDER — SERTRALINE HCL 100 MG PO TABS
150.0000 mg | ORAL_TABLET | Freq: Every day | ORAL | 3 refills | Status: DC
  Filled 2023-03-30 – 2023-03-31 (×2): qty 135, 90d supply, fill #0

## 2023-03-30 MED ORDER — ALBUTEROL SULFATE HFA 108 (90 BASE) MCG/ACT IN AERS
2.0000 | INHALATION_SPRAY | Freq: Four times a day (QID) | RESPIRATORY_TRACT | 1 refills | Status: DC | PRN
  Filled 2023-03-30: qty 6.7, 25d supply, fill #0

## 2023-03-31 ENCOUNTER — Other Ambulatory Visit (HOSPITAL_COMMUNITY): Payer: Self-pay

## 2023-04-13 ENCOUNTER — Other Ambulatory Visit (HOSPITAL_COMMUNITY): Payer: Self-pay

## 2023-04-13 MED ORDER — SERTRALINE HCL 50 MG PO TABS
75.0000 mg | ORAL_TABLET | Freq: Every day | ORAL | 0 refills | Status: DC
  Filled 2023-04-13: qty 135, 90d supply, fill #0

## 2023-04-15 DIAGNOSIS — F331 Major depressive disorder, recurrent, moderate: Secondary | ICD-10-CM | POA: Diagnosis not present

## 2023-04-15 DIAGNOSIS — F411 Generalized anxiety disorder: Secondary | ICD-10-CM | POA: Diagnosis not present

## 2023-05-06 ENCOUNTER — Other Ambulatory Visit (HOSPITAL_COMMUNITY): Payer: Self-pay

## 2023-05-17 ENCOUNTER — Other Ambulatory Visit (HOSPITAL_COMMUNITY): Payer: Self-pay

## 2023-07-05 ENCOUNTER — Other Ambulatory Visit (HOSPITAL_COMMUNITY): Payer: Self-pay

## 2023-07-05 MED ORDER — SERTRALINE HCL 100 MG PO TABS
100.0000 mg | ORAL_TABLET | Freq: Every day | ORAL | 1 refills | Status: DC
  Filled 2023-07-05: qty 30, 30d supply, fill #0
  Filled 2023-10-02: qty 30, 30d supply, fill #1

## 2023-07-06 ENCOUNTER — Other Ambulatory Visit (HOSPITAL_COMMUNITY): Payer: Self-pay

## 2023-07-06 MED ORDER — MUPIROCIN 2 % EX OINT
1.0000 | TOPICAL_OINTMENT | Freq: Two times a day (BID) | CUTANEOUS | 0 refills | Status: AC
  Filled 2023-07-06: qty 22, 11d supply, fill #0

## 2023-07-06 MED ORDER — LIDOCAINE HCL 1 % EX GEL
1.0000 | CUTANEOUS | 1 refills | Status: AC
  Filled 2023-11-29: qty 226, fill #0

## 2023-07-06 MED ORDER — ZINC OXIDE 40 % EX OINT
1.0000 | TOPICAL_OINTMENT | CUTANEOUS | 0 refills | Status: AC | PRN
  Filled 2023-11-29: qty 56, fill #0

## 2023-07-26 NOTE — Progress Notes (Signed)
Carmen Rodriguez is a 22 y.o. female here for a new problem.  History of Present Illness:   No chief complaint on file.   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    lights and blurry vision     Social History   Tobacco Use   Smoking status: Former    Types: E-cigarettes    Quit date: 2023    Years since quitting: 1.6    Passive exposure: Never   Smokeless tobacco: Never  Vaping Use   Vaping status: Former   Quit date: 12/20/2022  Substance Use Topics   Alcohol use: Yes    Comment: 1-3/wk   Drug use: Not Currently    Types: Marijuana    Past Surgical History:  Procedure Laterality Date   MOUTH SURGERY  2013   k-9   WISDOM TOOTH EXTRACTION Bilateral 2017    Family History  Problem Relation Age of Onset   Depression Mother    Healthy Mother    Hypertension Father    Hyperlipidemia Father    Diabetes Father    Asthma Father    Arthritis Father    Alcohol abuse Father    Healthy Father    Depression Sister    Asthma Sister    Cancer Maternal Grandmother    Heart disease Maternal Grandfather    Hypertension Paternal Grandmother    Hearing loss Paternal Grandfather    Hypertension Paternal Grandfather    Heart disease Paternal Grandfather    Heart attack Paternal Grandfather     No Known Allergies  Current Medications:   Current Outpatient Medications:    albuterol (VENTOLIN HFA) 108 (90 Base) MCG/ACT inhaler, Inhale 2 puffs into the lungs every 6 (six) hours as needed for wheezing, Disp: 6.7 g, Rfl: 1   betamethasone dipropionate 0.05 % lotion, Apply 1 application to the scalp daily; Taper use as able, Disp: 120 mL, Rfl: 3   Cetirizine HCl (ZYRTEC ALLERGY PO), Take by mouth., Disp: , Rfl:    cyanocobalamin (VITAMIN B12) 1000 MCG/ML injection, Inject 1 mL (1,000 mcg total) once a week as directed (Patient not taking: Reported on  12/20/2022), Disp: 4 mL, Rfl: 0   hydrocortisone 2.5 % cream, Apply 1 application on the skin nightly; Mix with ketoconazole for face-taper as able, Disp: 30 g, Rfl: 2   ketoconazole (NIZORAL) 2 % cream, Apply 1 application on the skin nightly; Mix with hydrocortisone for face-taper as able, Disp: 30 g, Rfl: 2   lamoTRIgine (LAMICTAL) 25 MG tablet, Take 1 tablet (25 mg total) by mouth daily., Disp: 90 tablet, Rfl: 1   Lidocaine HCl 1 % GEL, Apply topically to rectum prior to bowel movement., Disp: 226 g, Rfl: 1   liver oil-zinc oxide (BOUDREAUXS BUTT PASTE) 40 % ointment, Apply 1 Application topically to affected area every 2 hours as needed., Disp: 56 g, Rfl: 0   MELATONIN CR PO, Take by mouth., Disp: , Rfl:    mupirocin ointment (BACTROBAN) 2 %, Apply 1 Application topically to affected area 2 (two) times daily as needed, Disp: 22 g, Rfl: 0   polyethylene glycol powder (MIRALAX) 17 GM/SCOOP powder, Miralax 17 gram/dose oral powder  Take by oral route., Disp: , Rfl:    propranolol (INDERAL) 20 MG tablet, Take 1 tablet (  20 mg total) by mouth 3 (three) times daily as needed for panic attacks, Disp: 40 tablet, Rfl: 0   propranolol (INDERAL) 20 MG tablet, Take 1 tablet (20 mg total) by mouth 3 (three) times daily as needed for panic attack., Disp: 270 tablet, Rfl: 1   sertraline (ZOLOFT) 100 MG tablet, Take 1&1/2 tablets (150 mg total) by mouth daily., Disp: 135 tablet, Rfl: 3   sertraline (ZOLOFT) 100 MG tablet, Take 1 tablet (100 mg total) by mouth daily., Disp: 30 tablet, Rfl: 1   sertraline (ZOLOFT) 25 MG tablet, Take 1 tablet (25 mg total) by mouth at bedtime., Disp: 30 tablet, Rfl: 2   sertraline (ZOLOFT) 50 MG tablet, Take 1 tablet (50 mg total) by mouth daily for one month, then increase to 100 mg daily., Disp: 30 tablet, Rfl: 0   sertraline (ZOLOFT) 50 MG tablet, Take 1&1/2 tablets (75 mg total) by mouth daily., Disp: 135 tablet, Rfl: 0   SUMAtriptan (IMITREX) 25 MG tablet, Take by mouth., Disp:  , Rfl:    SYRINGE-NEEDLE, DISP, 3 ML (LUER LOCK SAFETY SYRINGES) 25G X 1" 3 ML MISC, Use with Vitamin B-12 intramuscular injection once a week (Patient not taking: Reported on 12/20/2022), Disp: 4 each, Rfl: 0   traZODone (DESYREL) 50 MG tablet, Take 1/2 to 1 tablet by mouth at night as needed for difficulty sleeping. Take 30 to 60 minutes before bed., Disp: 30 tablet, Rfl: 1   triamcinolone cream (KENALOG) 0.1 %, Apply to itchy areas on body one to two times daily as needed, Disp: 30 g, Rfl: 0   Review of Systems:   ROS  Vitals:   There were no vitals filed for this visit.   There is no height or weight on file to calculate BMI.  Physical Exam:   Physical Exam  Assessment and Plan:   ***   I,Alexander Ruley,acting as a scribe for Jarold Motto, PA.,have documented all relevant documentation on the behalf of Jarold Motto, PA,as directed by  Jarold Motto, PA while in the presence of Jarold Motto, [place].   ***   Jarold Motto, PA-C

## 2023-07-27 ENCOUNTER — Encounter: Payer: Self-pay | Admitting: Physician Assistant

## 2023-07-27 ENCOUNTER — Other Ambulatory Visit (HOSPITAL_BASED_OUTPATIENT_CLINIC_OR_DEPARTMENT_OTHER): Payer: Self-pay

## 2023-07-27 ENCOUNTER — Ambulatory Visit: Payer: Managed Care, Other (non HMO) | Admitting: Physician Assistant

## 2023-07-27 VITALS — BP 104/62 | HR 90 | Temp 98.2°F | Ht 67.0 in | Wt 154.6 lb

## 2023-07-27 DIAGNOSIS — J309 Allergic rhinitis, unspecified: Secondary | ICD-10-CM

## 2023-07-27 MED ORDER — MONTELUKAST SODIUM 10 MG PO TABS
10.0000 mg | ORAL_TABLET | Freq: Every day | ORAL | 0 refills | Status: DC
  Filled 2023-07-27: qty 30, 30d supply, fill #0

## 2023-07-27 MED ORDER — QVAR REDIHALER 40 MCG/ACT IN AERB
1.0000 | INHALATION_SPRAY | Freq: Two times a day (BID) | RESPIRATORY_TRACT | 0 refills | Status: AC
Start: 2023-07-27 — End: ?
  Filled 2023-07-27: qty 10.6, 30d supply, fill #0

## 2023-07-27 NOTE — Patient Instructions (Signed)
It was great to see you!  Start nightly singulair to help with allergies/asthma  Start qvar inhaler twice daily -- if any concerns with cost - please reach out   If new/worsening/lack of improvement - reach out  Take care,  Jarold Motto PA-C

## 2023-07-28 ENCOUNTER — Other Ambulatory Visit (HOSPITAL_COMMUNITY): Payer: Self-pay

## 2023-07-28 ENCOUNTER — Other Ambulatory Visit (HOSPITAL_BASED_OUTPATIENT_CLINIC_OR_DEPARTMENT_OTHER): Payer: Self-pay

## 2023-07-28 ENCOUNTER — Other Ambulatory Visit: Payer: Self-pay

## 2023-08-15 ENCOUNTER — Telehealth: Payer: Self-pay

## 2023-08-15 ENCOUNTER — Other Ambulatory Visit (HOSPITAL_BASED_OUTPATIENT_CLINIC_OR_DEPARTMENT_OTHER): Payer: Self-pay

## 2023-08-15 ENCOUNTER — Ambulatory Visit
Admission: EM | Admit: 2023-08-15 | Discharge: 2023-08-15 | Disposition: A | Discharge status: Home / Self Care | Payer: Managed Care, Other (non HMO) | Attending: Family Medicine | Admitting: Family Medicine

## 2023-08-15 ENCOUNTER — Ambulatory Visit: Payer: Managed Care, Other (non HMO)

## 2023-08-15 ENCOUNTER — Other Ambulatory Visit (HOSPITAL_COMMUNITY): Payer: Self-pay

## 2023-08-15 DIAGNOSIS — M659 Synovitis and tenosynovitis, unspecified: Secondary | ICD-10-CM | POA: Diagnosis not present

## 2023-08-15 DIAGNOSIS — M25531 Pain in right wrist: Secondary | ICD-10-CM

## 2023-08-15 DIAGNOSIS — M65931 Unspecified synovitis and tenosynovitis, right forearm: Secondary | ICD-10-CM

## 2023-08-15 MED ORDER — PREDNISONE 10 MG PO TABS
ORAL_TABLET | ORAL | 0 refills | Status: DC
  Filled 2023-08-15: qty 42, 21d supply, fill #0

## 2023-08-15 MED ORDER — CELECOXIB 200 MG PO CAPS
200.0000 mg | ORAL_CAPSULE | Freq: Every day | ORAL | 0 refills | Status: AC
  Filled 2023-08-15: qty 15, 15d supply, fill #0

## 2023-08-15 NOTE — ED Provider Notes (Addendum)
[facility]    CSN: [id number] Arrival date & time: 08/15/23  1543      History   Chief Complaint Chief Complaint  Patient presents with   Wrist Pain    RT    HPI Carmen Rodriguez is a 22 y.o. female.   HPI 22 year old female presents with right wrist pain for 3 months.  Reports pain worsening in the last 4 days.  Reports works full-time on [employer] for her job and has recently had tingling and numbness in wrist radiating to fingers.  Past Medical History:  Diagnosis Date   ADHD    Allergy    Anxiety    Asthma    EIA   Depression    Migraine aura occurring with and without headache    lights and blurry vision    There are no problems to display for this patient.   Past Surgical History:  Procedure Laterality Date   MOUTH SURGERY  2013   k-9   WISDOM TOOTH EXTRACTION Bilateral 2017    OB History   No obstetric history on file.      Home Medications    Prior to Admission medications   Medication Sig Start Date End Date Taking? Authorizing Provider  celecoxib (CELEBREX) 200 MG capsule Take 1 capsule (200 mg total) by mouth daily for 15 days. 08/15/23 08/30/23 Yes Trevor Iha, FNP  predniSONE (DELTASONE) 10 MG tablet Taper as directed 08/15/23  Yes Trevor Iha, FNP  albuterol (VENTOLIN HFA) 108 (90 Base) MCG/ACT inhaler Inhale 2 puffs into the lungs every 6 (six) hours as needed for wheezing 03/30/23     beclomethasone (QVAR REDIHALER) 40 MCG/ACT inhaler Inhale 1 puff into the lungs 2 (two) times daily. 07/27/23   Jarold Motto, PA  betamethasone dipropionate 0.05 % lotion Apply 1 application to the scalp daily; Taper use as able Patient taking differently: Apply topically as needed. 06/19/21     Cetirizine HCl (ZYRTEC ALLERGY PO) Take by mouth.    [provider]  hydrocortisone 2.5 % cream Apply 1 application on the skin nightly; Mix with ketoconazole for face-taper as able 06/19/21     ketoconazole (NIZORAL) 2 % cream Apply 1  application on the skin nightly; Mix with hydrocortisone for face-taper as able 06/19/21     Lidocaine HCl 1 % GEL Apply topically to rectum prior to bowel movement. 07/06/23   Helane Rima, DO  liver oil-zinc oxide (BOUDREAUXS BUTT PASTE) 40 % ointment Apply 1 Application topically to affected area every 2 hours as needed. 07/06/23     MELATONIN CR PO Take by mouth.    [provider]  montelukast (SINGULAIR) 10 MG tablet Take 1 tablet (10 mg total) by mouth at bedtime. 07/27/23   Jarold Motto, PA  mupirocin ointment (BACTROBAN) 2 % Apply 1 Application topically to affected area 2 (two) times daily as needed 07/06/23     polyethylene glycol powder (MIRALAX) 17 GM/SCOOP powder Miralax 17 gram/dose oral powder  Take by oral route.    [provider]  propranolol (INDERAL) 20 MG tablet Take 1 tablet (20 mg total) by mouth 3 (three) times daily as needed for panic attack. 03/28/23     sertraline (ZOLOFT) 100 MG tablet Take 1 tablet (100 mg total) by mouth daily. 07/05/23     SUMAtriptan (IMITREX) 25 MG tablet Take by mouth. 04/29/17   [provider]  SYRINGE-NEEDLE, DISP, 3 ML (LUER LOCK SAFETY SYRINGES) 25G X 1" 3 ML MISC Use with  Vitamin B-12 intramuscular injection once a week 08/02/22     traZODone (DESYREL) 50 MG tablet Take 1/2 to 1 tablet by mouth at night as needed for difficulty sleeping. Take 30 to 60 minutes before bed. 10/13/21     triamcinolone cream (KENALOG) 0.1 % Apply to itchy areas on body one to two times daily as needed 06/19/21       Family History Family History  Problem Relation Age of Onset   Depression Mother    Healthy Mother    Hypertension Father    Hyperlipidemia Father    Diabetes Father    Asthma Father    Arthritis Father    Alcohol abuse Father    Healthy Father    Depression Sister    Asthma Sister    Cancer Maternal Grandmother    Heart disease Maternal Grandfather    Hypertension Paternal Grandmother    Hearing loss Paternal  Grandfather    Hypertension Paternal Grandfather    Heart disease Paternal Grandfather    Heart attack Paternal Grandfather     Social History Social History   Tobacco Use   Smoking status: Former    Types: E-cigarettes    Quit date: 2023    Years since quitting: 1.7    Passive exposure: Never   Smokeless tobacco: Never  Vaping Use   Vaping status: Former   Quit date: 12/20/2022  Substance Use Topics   Alcohol use: Yes    Comment: 1-3/wk   Drug use: Not Currently    Types: Marijuana     Allergies   Cat hair extract, Dust mite extract, Molds & smuts, and Pollen extract   Review of Systems Review of Systems   Physical Exam Triage Vital Signs ED Triage Vitals [08/15/23 1553]  Encounter Vitals Group     BP 117/76     Systolic BP Percentile      Diastolic BP Percentile      Pulse Rate 70     Resp 17     Temp 98.5 F (36.9 C)     Temp Source Oral     SpO2 98 %     Weight      Height      Head Circumference      Peak Flow      Pain Score      Pain Loc      Pain Education      Exclude from Growth Chart    No data found.  Updated Vital Signs BP 117/76 (BP Location: Right Arm)   Pulse 70   Temp 98.5 F (36.9 C) (Oral)   Resp 17   LMP 07/18/2023 (Exact Date)   SpO2 98%      Physical Exam Vitals and nursing note reviewed.  Constitutional:      Appearance: Normal appearance. She is normal weight.  HENT:     Head: Normocephalic and atraumatic.     Mouth/Throat:     Mouth: Mucous membranes are moist.     Pharynx: Oropharynx is clear.  Eyes:     Extraocular Movements: Extraocular movements intact.     Conjunctiva/sclera: Conjunctivae normal.     Pupils: Pupils are equal, round, and reactive to light.  Cardiovascular:     Rate and Rhythm: Normal rate and regular rhythm.     Pulses: Normal pulses.     Heart sounds: Normal heart sounds.  Pulmonary:     Effort: Pulmonary effort is normal.     Breath sounds: Normal breath  sounds. No wheezing, rhonchi  or rales.  Musculoskeletal:        General: Normal range of motion.     Cervical back: Normal range of motion and neck supple.  Skin:    General: Skin is warm and dry.  Neurological:     General: No focal deficit present.     Mental Status: She is alert and oriented to person, place, and time. Mental status is at baseline.  Psychiatric:        Mood and Affect: Mood normal.        Behavior: Behavior normal.        Thought Content: Thought content normal.      UC Treatments / Results  Labs (all labs ordered are listed, but only abnormal results are displayed) Labs Reviewed - No data to display  EKG   Radiology No results found.  Procedures Procedures (including critical care time)  Medications Ordered in UC Medications - No data to display  Initial Impression / Assessment and Plan / UC Course  I have reviewed the triage vital signs and the nursing notes.  Pertinent labs & imaging results that were available during my care of the patient were reviewed by me and considered in my medical decision making (see chart for details).     MDM: 1.  Tenosynovitis of right wrist-right wrist brace placed on patient prior to discharge, Rx'd Sterapred Unipak (tapering from 60 mg to 10 mg over 10 days; 2.  Right wrist pain-right wrist x-ray results revealed above, Rx'd Celebrex 200 mg capsule: Take 1 capsule daily for the next 15 days. Advised patient to take medications as directed with food to completion.  Advised patient to wear right wrist brace for the next 2 weeks 24/7 (except when bathing).  Advised we will follow-up with x-ray results once received.  Advised if symptoms worsen and/or unresolved please follow-up with [facility] for further evaluation.  Contact information provided with his AVS today.  Final Clinical Impressions(s) / UC Diagnoses   Final diagnoses:  Right wrist pain  Tenosynovitis of right wrist     Discharge Instructions      Advised patient to  take medications as directed with food to completion.  Advised patient to wear right wrist brace for the next 2 weeks 24/7 (except when bathing).  Advised we will follow-up with x-ray results once received.  Advised if symptoms worsen and/or unresolved please follow-up with [facility] orthopedics for further evaluation.  Contact information provided with his AVS today.     ED Prescriptions     Medication Sig Dispense Auth. Provider   celecoxib (CELEBREX) 200 MG capsule Take 1 capsule (200 mg total) by mouth daily for 15 days. 15 capsule Trevor Iha, FNP   predniSONE (DELTASONE) 10 MG tablet Taper as directed 42 tablet Trevor Iha, FNP      PDMP not reviewed this encounter.   Trevor Iha, FNP 08/15/23 1643    Trevor Iha, FNP 08/15/23 1743

## 2023-08-15 NOTE — Discharge Instructions (Addendum)
Advised patient to take medications as directed with food to completion.  Advised patient to wear right wrist brace for the next 2 weeks 24/7 (except when bathing).  Advised we will follow-up with x-ray results once received.  Advised if symptoms worsen and/or unresolved please follow-up with [facility] for further evaluation.  Contact information provided with his AVS today.

## 2023-08-15 NOTE — Telephone Encounter (Signed)
Pt notified of nml xray results. Encouraged to continue to wear wrist brace and take steroids until completion. F/u with sports medicine if no improvement. Pt verbalized understanding.

## 2023-08-15 NOTE — ED Triage Notes (Addendum)
Pt c/o RT wrist pain x 3 months. Pain worsening in the last 4 days. Denies injury. Ibuprofen prn. Last dose was at noon today. Works on [employer] for her job. Yesterday had some tingling/numbness in wrist radiating to fingers.

## 2023-08-29 ENCOUNTER — Other Ambulatory Visit (HOSPITAL_BASED_OUTPATIENT_CLINIC_OR_DEPARTMENT_OTHER): Payer: Self-pay

## 2023-08-29 ENCOUNTER — Other Ambulatory Visit: Payer: Self-pay | Admitting: Physician Assistant

## 2023-08-29 MED ORDER — MONTELUKAST SODIUM 10 MG PO TABS
10.0000 mg | ORAL_TABLET | Freq: Every day | ORAL | 0 refills | Status: DC
  Filled 2023-08-29: qty 30, 30d supply, fill #0

## 2023-10-02 ENCOUNTER — Other Ambulatory Visit: Payer: Self-pay | Admitting: Physician Assistant

## 2023-10-03 ENCOUNTER — Other Ambulatory Visit: Payer: Self-pay

## 2023-10-03 ENCOUNTER — Other Ambulatory Visit (HOSPITAL_COMMUNITY): Payer: Self-pay

## 2023-10-03 ENCOUNTER — Other Ambulatory Visit: Payer: Self-pay | Admitting: Family Medicine

## 2023-10-03 MED ORDER — SERTRALINE HCL 100 MG PO TABS
100.0000 mg | ORAL_TABLET | Freq: Every day | ORAL | 0 refills | Status: DC
  Filled 2023-10-03: qty 90, 90d supply, fill #0

## 2023-10-03 MED ORDER — MONTELUKAST SODIUM 10 MG PO TABS
10.0000 mg | ORAL_TABLET | Freq: Every day | ORAL | 0 refills | Status: DC
  Filled 2023-10-03: qty 30, 30d supply, fill #0

## 2023-10-30 ENCOUNTER — Other Ambulatory Visit: Payer: Self-pay | Admitting: Family Medicine

## 2023-10-31 ENCOUNTER — Other Ambulatory Visit: Payer: Self-pay

## 2023-10-31 ENCOUNTER — Other Ambulatory Visit (HOSPITAL_COMMUNITY): Payer: Self-pay

## 2023-10-31 MED ORDER — MONTELUKAST SODIUM 10 MG PO TABS
10.0000 mg | ORAL_TABLET | Freq: Every day | ORAL | 0 refills | Status: DC
  Filled 2023-10-31: qty 30, 30d supply, fill #0

## 2023-11-29 ENCOUNTER — Encounter (HOSPITAL_COMMUNITY): Payer: Self-pay

## 2023-11-29 ENCOUNTER — Other Ambulatory Visit: Payer: Self-pay | Admitting: Family Medicine

## 2023-11-29 ENCOUNTER — Other Ambulatory Visit (HOSPITAL_COMMUNITY): Payer: Self-pay

## 2023-11-29 NOTE — Telephone Encounter (Signed)
 Needs appt if I am managing psych meds

## 2023-12-01 ENCOUNTER — Other Ambulatory Visit: Payer: Self-pay | Admitting: Family Medicine

## 2023-12-01 ENCOUNTER — Other Ambulatory Visit (HOSPITAL_COMMUNITY): Payer: Self-pay

## 2023-12-01 ENCOUNTER — Ambulatory Visit: Payer: Self-pay | Admitting: Family Medicine

## 2023-12-01 NOTE — Telephone Encounter (Signed)
Copied from CRM [id number]. Topic: Clinical - Medication Refill >> Dec 01, 2023  4:21 PM Adelina Mings wrote: Most Recent Primary Care Visit:  Provider: Jarold Motto  Department: [facility]  Visit Type: SAME DAY  Date: 07/27/2023  Medication: montelukast (SINGULAIR) 10 MG tablet  Has the patient contacted their pharmacy? Yes (Agent: If no, request that the patient contact the pharmacy for the refill. If patient does not wish to contact the pharmacy document the reason why and proceed with request.) (Agent: If yes, when and what did the pharmacy advise?)  Is this the correct pharmacy for this prescription?  If no, delete pharmacy and type the correct one.  This is the patient's preferred pharmacy:     Has the prescription been filled recently? No  Is the patient out of the medication? Yes  Has the patient been seen for an appointment in the last year OR does the patient have an upcoming appointment? No  Can we respond through MyChart? No  Agent: Please be advised that Rx refills may take up to 3 business days. We ask that you follow-up with your pharmacy.

## 2023-12-02 ENCOUNTER — Other Ambulatory Visit (HOSPITAL_COMMUNITY): Payer: Self-pay

## 2023-12-02 MED ORDER — MONTELUKAST SODIUM 10 MG PO TABS
10.0000 mg | ORAL_TABLET | Freq: Every day | ORAL | 0 refills | Status: DC
  Filled 2023-12-02: qty 30, 30d supply, fill #0

## 2023-12-22 ENCOUNTER — Ambulatory Visit (INDEPENDENT_AMBULATORY_CARE_PROVIDER_SITE_OTHER): Payer: Managed Care, Other (non HMO) | Admitting: Family Medicine

## 2023-12-22 ENCOUNTER — Other Ambulatory Visit (HOSPITAL_COMMUNITY): Payer: Self-pay

## 2023-12-22 ENCOUNTER — Encounter: Payer: Self-pay | Admitting: Family Medicine

## 2023-12-22 VITALS — BP 108/71 | HR 73 | Temp 98.7°F | Resp 16 | Ht 67.0 in | Wt 154.0 lb

## 2023-12-22 DIAGNOSIS — F419 Anxiety disorder, unspecified: Secondary | ICD-10-CM

## 2023-12-22 DIAGNOSIS — J4520 Mild intermittent asthma, uncomplicated: Secondary | ICD-10-CM

## 2023-12-22 DIAGNOSIS — Z0000 Encounter for general adult medical examination without abnormal findings: Secondary | ICD-10-CM | POA: Diagnosis not present

## 2023-12-22 DIAGNOSIS — J3089 Other allergic rhinitis: Secondary | ICD-10-CM

## 2023-12-22 DIAGNOSIS — F5101 Primary insomnia: Secondary | ICD-10-CM

## 2023-12-22 DIAGNOSIS — F32A Depression, unspecified: Secondary | ICD-10-CM

## 2023-12-22 MED ORDER — MONTELUKAST SODIUM 10 MG PO TABS
10.0000 mg | ORAL_TABLET | Freq: Every day | ORAL | 3 refills | Status: AC
  Filled 2023-12-22: qty 90, 90d supply, fill #0
  Filled 2024-04-02: qty 90, 90d supply, fill #1
  Filled 2024-07-05: qty 90, 90d supply, fill #2
  Filled 2024-10-05: qty 90, 90d supply, fill #3

## 2023-12-22 MED ORDER — SERTRALINE HCL 100 MG PO TABS
100.0000 mg | ORAL_TABLET | Freq: Every day | ORAL | 1 refills | Status: DC
  Filled 2023-12-22: qty 90, 90d supply, fill #0
  Filled 2024-04-02: qty 90, 90d supply, fill #1

## 2023-12-22 MED ORDER — TRAZODONE HCL 50 MG PO TABS
25.0000 mg | ORAL_TABLET | Freq: Every evening | ORAL | 1 refills | Status: DC
  Filled 2023-12-22: qty 90, 90d supply, fill #0
  Filled 2024-04-02: qty 90, 90d supply, fill #1

## 2023-12-22 NOTE — Assessment & Plan Note (Signed)
 Chronic.  Controlled on singulair 10mg  daily

## 2023-12-22 NOTE — Patient Instructions (Signed)
 It was very nice to see you today!  Send copy of labs   PLEASE NOTE:  If you had any lab tests please let us  know if you have not heard back within a few days. You may see your results on MyChart before we have a chance to review them but we will give you a call once they are reviewed by us . If we ordered any referrals today, please let us  know if you have not heard from their office within the next week.   Please try these tips to maintain a healthy lifestyle:  Eat most of your calories during the day when you are active. Eliminate processed foods including packaged sweets (pies, cakes, cookies), reduce intake of potatoes, white bread, white pasta, and white rice. Look for whole grain options, oat flour or almond flour.  Each meal should contain half fruits/vegetables, one quarter protein, and one quarter carbs (no bigger than a computer mouse).  Cut down on sweet beverages. This includes juice, soda, and sweet tea. Also watch fruit intake, though this is a healthier sweet option, it still contains natural sugar! Limit to 3 servings daily.  Drink at least 1 glass of water with each meal and aim for at least 8 glasses per day  Exercise at least 150 minutes every week.

## 2023-12-22 NOTE — Progress Notes (Signed)
 Phone [phone]   Subjective:   Patient is a 23 y.o. female presenting for annual physical.    Chief Complaint  Patient presents with   Annual Exam    CPE Fasting     Annual-some  exercise.  Pap Dr. Latisha.  Wellness Dr. Prentiss for wt.  Will be doing labs soon. Asthma-Spring and Fall flare. Takes singulair  daily which helps asthma and allergies.  Anxiety/depression-doing well on zoloft  100mg .  No SI Insomnia-occ trazodone  50mg  Migraines-better.  Imitrex occ.  No auras  See problem oriented charting- ROS- ROS: Gen: no fever, chills  Skin: no rash, itching ENT: no ear pain, ear drainage, nasal congestion, rhinorrhea, sinus pressure, sore throat Eyes: no blurry vision, double vision Resp: no cough, wheeze,SOB CV: no CP, palpitations, LE edema,  GI: no heartburn, n/v/d/, abd pain.  Constipation - takes stool softeners most days.  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 headache    lights and blurry vision   Patient Active Problem List   Diagnosis Date Noted   Anxiety and depression 12/22/2023   Mild intermittent asthma without complication 12/22/2023   Non-seasonal allergic rhinitis 12/22/2023   Primary insomnia 12/22/2023   Past Surgical History:  Procedure Laterality Date   MOUTH SURGERY  2013   k-9   WISDOM TOOTH EXTRACTION Bilateral 2017    Family History  Problem Relation Age of Onset   Depression Mother    Healthy Mother    Hypertension Father    Hyperlipidemia Father    Diabetes Father    Asthma Father    Arthritis Father    Alcohol abuse Father    Healthy Father    Depression Sister    Asthma Sister     Cancer Maternal Grandmother    Heart disease Maternal Grandfather    Hypertension Paternal Grandmother    Hearing loss Paternal Grandfather    Hypertension Paternal Grandfather    Heart disease Paternal Grandfather    Heart attack Paternal Grandfather     Medications- reviewed and updated Current Outpatient Medications  Medication Sig Dispense Refill   albuterol  (VENTOLIN  HFA) 108 (90 Base) MCG/ACT inhaler Inhale 2 puffs into the lungs every 6 (six) hours as needed for wheezing 6.7 g 1   beclomethasone (QVAR  REDIHALER) 40 MCG/ACT inhaler Inhale 1 puff into the lungs 2 (two) times daily. 10.6 g 0   betamethasone  dipropionate 0.05 % lotion Apply 1 application to the scalp daily; Taper use as able (Patient taking differently: Apply topically as needed.) 120 mL 3   Cetirizine HCl (ZYRTEC ALLERGY PO) Take by mouth.     hydrocortisone  2.5 % cream Apply 1 application on the skin nightly; Mix with ketoconazole  for face-taper as able 30 g 2   ketoconazole  (NIZORAL ) 2 % cream Apply 1 application on the skin nightly; Mix with hydrocortisone  for face-taper as able 30 g 2   Lidocaine  HCl 1 % GEL Apply topically to rectum prior to bowel movement. 226 g 1   liver oil-zinc  oxide (BOUDREAUXS BUTT PASTE) 40 % ointment Apply 1 Application topically to affected area every 2 hours as needed. 56 g 0  MELATONIN CR PO Take by mouth.     mupirocin  ointment (BACTROBAN ) 2 % Apply 1 Application topically to affected area 2 (two) times daily as needed 22 g 0   polyethylene glycol powder (MIRALAX) 17 GM/SCOOP powder Miralax 17 gram/dose oral powder  Take by oral route.     propranolol  (INDERAL ) 20 MG tablet Take 1 tablet (20 mg total) by mouth 3 (three) times daily as needed for panic attack. 270 tablet 1   SUMAtriptan (IMITREX) 25 MG tablet Take by mouth.     SYRINGE-NEEDLE, DISP, 3 ML (LUER LOCK SAFETY SYRINGES) 25G X 1 3 ML MISC Use with Vitamin B-12 intramuscular injection once a week 4 each 0   triamcinolone   cream (KENALOG ) 0.1 % Apply to itchy areas on body one to two times daily as needed 30 g 0   montelukast  (SINGULAIR ) 10 MG tablet Take 1 tablet (10 mg total) by mouth at bedtime. 90 tablet 3   sertraline  (ZOLOFT ) 100 MG tablet Take 1 tablet (100 mg total) by mouth daily. 90 tablet 1   traZODone  (DESYREL ) 50 MG tablet Take 1/2 to 1 tablet by mouth at night as needed for difficulty sleeping. Take 30 to 60 minutes before bed. 90 tablet 1   No current facility-administered medications for this visit.    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 DP 2+B LUNGS: CTAB. No wheezes ABDOMEN:  BS+, soft, NTND, No HSM, no masses EXT:  no edema MSK: no gross abnormalities. MS 5/5 all 4 NEURO: A&O x3.  CN II-XII intact.  PSYCH: normal mood. Good eye contact      Assessment and Plan   Health Maintenance counseling: 1. Anticipatory guidance: Patient counseled regarding regular dental exams q6 months, eye exams,  avoiding smoking and second hand smoke, limiting alcohol to 1 beverage per day, no illicit drugs.   2. Risk factor reduction:  Advised patient of need for regular exercise and diet rich and fruits and vegetables to reduce risk of heart attack and stroke. Exercise- encouraged.  Wt Readings from Last 3 Encounters:  12/22/23 154 lb (69.9  kg)  07/27/23 154 lb 9.6 oz (70.1 kg)  12/20/22 150 lb 8 oz (68.3 kg)   3. Immunizations/screenings/ancillary studies Immunization History  Administered Date(s) Administered   Adenovirus 04/29/2017, 05/30/2017   DTaP 02/27/1999, 04/27/1999, 07/15/1999, 08/01/2000, 01/01/2003   Dtap, Unspecified 02/27/1999, 04/27/1999, 07/15/1999, 08/01/2000, 01/01/2003   Fluzone Influenza virus vaccine,trivalent (IIV3), split virus 09/08/2007, 07/23/2008, 07/24/2009   HIB (PRP-OMP) 02/27/1999, 04/27/1999, 07/15/1999, 01/25/2000   HIB (PRP-T) 02/27/1999, 04/27/1999, 07/15/1999, 01/25/2000   HPV Quadrivalent 03/15/2011, 05/18/2011, 09/24/2011   Hep A, Unspecified 01/10/2006, 01/16/2007   Hep B, Unspecified 07/15/1999, 10/27/1999, 01/25/2000   Hepatitis  A, Ped/Adol-2 Dose 01/10/2006, 01/16/2007   Hepatitis B, PED/ADOLESCENT 07/15/1999, 10/27/1999, 01/25/2000   IPV 02/27/1999, 04/27/1999, 07/15/1999, 01/01/2003   Influenza Split 09/10/2015   Influenza, High Dose Seasonal PF 08/19/2017   Influenza, Seasonal, Injecte, Preservative Fre 09/29/2006, 08/11/2010, 08/31/2011   Influenza,inj,Quad PF,6+ Mos 08/14/2012, 07/23/2013, 08/28/2014, 09/10/2015, 07/28/2016   Influenza,inj,quad, With Preservative 09/10/2015   MMR 01/25/2000, 01/01/2003   Meningococcal B, OMV 04/29/2017, 05/30/2017   Meningococcal Conjugate 03/15/2011, 03/24/2015   Moderna Sars-Covid-2 Vaccination 02/04/2020, 03/03/2020, 11/29/2020   Pneumococcal Conjugate PCV 7 10/27/1999, 01/25/2000, 08/01/2000   Polio, Unspecified 02/27/1999, 04/27/1999, 07/15/1999, 01/01/2003   Td (Adult),5 Lf Tetanus Toxid, Preservative Free 03/11/2010   Tdap 03/11/2010, 06/08/2017   Varicella 01/25/2000, 01/10/2006   Health Maintenance Due  Topic Date Due   HIV Screening  Never done   Hepatitis C Screening  Never done   Cervical Cancer Screening (Pap smear)  Never done    4. Cervical cancer screening: will get copy 5. Skin cancer screening- advised regular  sunscreen use. Denies worrisome, changing, or new skin lesions.  6. Birth control/STD check: n/a 7. Smoking associated screening: non smoker 8. Alcohol screening: neg  Wellness examination  Anxiety and depression Assessment & Plan: Chronic.  Controlled.  Continue zoloft  100mg  daily   Mild intermittent asthma without complication Assessment & Plan: Chronic.  Controlled on singulair  10mg  daily.  Flares seasonally(Spring/Fall) and will use qvar  and albuterol    Non-seasonal allergic rhinitis, unspecified trigger Assessment & Plan: Chronic.  Controlled on singulair  10mg  daily   Primary insomnia Assessment & Plan: Chronic.  Controlled on trazodone  50mg  hs prn   Other orders -     Montelukast  Sodium; Take 1 tablet (10 mg total) by mouth at bedtime.  Dispense: 90 tablet; Refill: 3 -     Sertraline  HCl; Take 1 tablet (100 mg total) by mouth daily.  Dispense: 90 tablet; Refill: 1 -     traZODone  HCl; Take 1/2 to 1 tablet by mouth at night as needed for difficulty sleeping. Take 30 to 60 minutes before bed.  Dispense: 90 tablet; Refill: 1   [facility] guidance.  [facility] and send copy.  Will get copy pap   Recommended follow up: Return in about 6 months (around 06/20/2024) for mood.  Lab/Order associations:n/a fasting   Jenkins CHRISTELLA Carrel, MD

## 2023-12-22 NOTE — Assessment & Plan Note (Signed)
 Chronic.  Controlled on trazodone  50mg  hs prn

## 2023-12-22 NOTE — Assessment & Plan Note (Signed)
 Chronic.  Controlled.  Continue zoloft  100mg  daily

## 2023-12-22 NOTE — Assessment & Plan Note (Signed)
 Chronic.  Controlled on singulair  10mg  daily.  Flares seasonally(Spring/Fall) and will use qvar  and albuterol 

## 2024-04-03 ENCOUNTER — Other Ambulatory Visit (HOSPITAL_COMMUNITY): Payer: Self-pay

## 2024-06-20 ENCOUNTER — Other Ambulatory Visit (HOSPITAL_BASED_OUTPATIENT_CLINIC_OR_DEPARTMENT_OTHER): Payer: Self-pay

## 2024-06-20 ENCOUNTER — Other Ambulatory Visit (HOSPITAL_COMMUNITY): Payer: Self-pay

## 2024-06-20 ENCOUNTER — Encounter: Payer: Self-pay | Admitting: Family Medicine

## 2024-06-20 ENCOUNTER — Ambulatory Visit: Payer: Managed Care, Other (non HMO) | Admitting: Family Medicine

## 2024-06-20 VITALS — BP 102/65 | HR 85 | Temp 98.1°F | Resp 18 | Ht 67.0 in | Wt 156.0 lb

## 2024-06-20 DIAGNOSIS — F419 Anxiety disorder, unspecified: Secondary | ICD-10-CM

## 2024-06-20 DIAGNOSIS — R1110 Vomiting, unspecified: Secondary | ICD-10-CM | POA: Diagnosis not present

## 2024-06-20 DIAGNOSIS — F32A Depression, unspecified: Secondary | ICD-10-CM

## 2024-06-20 DIAGNOSIS — F5101 Primary insomnia: Secondary | ICD-10-CM

## 2024-06-20 DIAGNOSIS — J029 Acute pharyngitis, unspecified: Secondary | ICD-10-CM

## 2024-06-20 DIAGNOSIS — J4520 Mild intermittent asthma, uncomplicated: Secondary | ICD-10-CM | POA: Diagnosis not present

## 2024-06-20 DIAGNOSIS — R197 Diarrhea, unspecified: Secondary | ICD-10-CM

## 2024-06-20 DIAGNOSIS — J020 Streptococcal pharyngitis: Secondary | ICD-10-CM

## 2024-06-20 LAB — POC COVID19 BINAXNOW: SARS Coronavirus 2 Ag: NEGATIVE

## 2024-06-20 LAB — POCT RAPID STREP A (OFFICE): Rapid Strep A Screen: POSITIVE — AB

## 2024-06-20 MED ORDER — ONDANSETRON HCL 4 MG PO TABS
4.0000 mg | ORAL_TABLET | Freq: Three times a day (TID) | ORAL | 0 refills | Status: AC | PRN
  Filled 2024-06-20 (×2): qty 20, 7d supply, fill #0

## 2024-06-20 MED ORDER — BENZONATATE 100 MG PO CAPS
100.0000 mg | ORAL_CAPSULE | Freq: Three times a day (TID) | ORAL | 0 refills | Status: AC | PRN
  Filled 2024-06-20 (×2): qty 20, 7d supply, fill #0

## 2024-06-20 MED ORDER — ALBUTEROL SULFATE HFA 108 (90 BASE) MCG/ACT IN AERS
2.0000 | INHALATION_SPRAY | Freq: Four times a day (QID) | RESPIRATORY_TRACT | 1 refills | Status: AC | PRN
  Filled 2024-06-20 (×2): qty 6.7, 25d supply, fill #0

## 2024-06-20 MED ORDER — AMOXICILLIN 500 MG PO CAPS
500.0000 mg | ORAL_CAPSULE | Freq: Three times a day (TID) | ORAL | 0 refills | Status: AC
  Filled 2024-06-20 (×2): qty 30, 10d supply, fill #0

## 2024-06-20 MED ORDER — SERTRALINE HCL 100 MG PO TABS
100.0000 mg | ORAL_TABLET | Freq: Every day | ORAL | 1 refills | Status: AC
  Filled 2024-06-20 (×2): qty 90, 90d supply, fill #0
  Filled 2024-10-05: qty 90, 90d supply, fill #1

## 2024-06-20 MED ORDER — TRAZODONE HCL 50 MG PO TABS
25.0000 mg | ORAL_TABLET | Freq: Every evening | ORAL | 1 refills | Status: AC
  Filled 2024-06-20 (×2): qty 90, 90d supply, fill #0
  Filled 2024-10-05: qty 90, 90d supply, fill #1

## 2024-06-20 NOTE — Progress Notes (Signed)
 Subjective:     Patient ID: Carmen Rodriguez, female    DOB: 2001-10-20, 23 y.o.   MRN: [id number]  Chief Complaint  Patient presents with   Medical Management of Chronic Issues    6 month follow-up on mood     Sore Throat    Started 1 week ago, still sore, no congestion, swallowing mucus, vomiting last night   Generalized Body Aches    Sx started last night   Ear Pain    Right ear pain off and on     HPI Discussed the use of AI scribe software for clinical note transcription with the patient, who gave verbal consent to proceed.  History of Present Illness Carmen Rodriguez is a 23 year old female who presents with follow-up on mood management and evaluation of acute sore throat and associated symptoms.  She is currently taking sertraline  100 mg daily. No suicidal ideation. She also takes trazodone , usually 50 mg, for sleep, though she feels her body is getting used to it. She has tried melatonin previously and is considering magnesium glycinate as a supplement. Her sleep is disrupted by her work schedule at [employer], which causes her to feel 'hyped up' when she returns home late at night.  She reports a sore throat that began about a week ago as a small, annoying scratchy sensation, progressing to pain over the past two days. Last night, she experienced body aches and vomiting. She has felt feverish with chills and sweats but has not measured her temperature. She took Motrin last night but not today. She also experienced diarrhea this morning.  She mentions right ear pain and upper back teeth pain on the same side, which started in the past couple of days. She has a runny nose with mucus dripping down the back of her throat but denies nasal congestion. She has felt chest tightness and considered using her inhaler but has not done so yet. She has coughed occasionally but does not describe it as a persistent cough.  She recalls having COVID in 2022 and notes that her current body  aches are similar to those she experienced then. She has had difficulty keeping fluids down today.  Her current medications include sertraline  100 mg daily, trazodone  50 mg as needed for sleep, Singulair  daily for allergies, and she has an albuterol  inhaler for asthma. She also has propranolol  and Maxalt but does not need refills for these at the moment. She uses [facility] for her prescriptions.  She works at [employer] and has another job that requires her to wake up early in the morning. 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    lights and blurry vision    Past Surgical History:  Procedure Laterality Date   MOUTH SURGERY  2013   k-9   WISDOM TOOTH EXTRACTION Bilateral 2017     Current Outpatient Medications:    amoxicillin  (AMOXIL ) 500 MG capsule, Take 1 capsule (500 mg total) by mouth 3 (three) times daily for 10 days., Disp: 30 capsule,  Rfl: 0   beclomethasone (QVAR  REDIHALER) 40 MCG/ACT inhaler, Inhale 1 puff into the lungs 2 (two) times daily., Disp: 10.6 g, Rfl: 0   benzonatate  (TESSALON  PERLES) 100 MG capsule, Take 1 capsule (100 mg total) by mouth 3 (three) times daily as needed., Disp: 20 capsule, Rfl: 0   betamethasone  dipropionate 0.05 % lotion, Apply 1 application to the scalp daily; Taper use as able (Patient taking differently: Apply topically as needed.), Disp: 120 mL, Rfl: 3   Cetirizine HCl (ZYRTEC ALLERGY PO), Take by mouth., Disp: , Rfl:    hydrocortisone  2.5 % cream, Apply 1 application on the skin nightly; Mix with ketoconazole  for face-taper as able, Disp: 30 g, Rfl: 2   ketoconazole  (NIZORAL ) 2 % cream, Apply 1  application on the skin nightly; Mix with hydrocortisone  for face-taper as able, Disp: 30 g, Rfl: 2   Lidocaine  HCl 1 % GEL, Apply topically to rectum prior to bowel movement., Disp: 226 g, Rfl: 1   liver oil-zinc  oxide (BOUDREAUXS BUTT PASTE) 40 % ointment, Apply 1 Application topically to affected area every 2 hours as needed., Disp: 56 g, Rfl: 0   MELATONIN CR PO, Take by mouth., Disp: , Rfl:    montelukast  (SINGULAIR ) 10 MG tablet, Take 1 tablet (10 mg total) by mouth at bedtime., Disp: 90 tablet, Rfl: 3   mupirocin  ointment (BACTROBAN ) 2 %, Apply 1 Application topically to affected area 2 (two) times daily as needed, Disp: 22 g, Rfl: 0   ondansetron  (ZOFRAN ) 4 MG tablet, Take 1 tablet (4 mg total) by mouth every 8 (eight) hours as needed for nausea or vomiting., Disp: 20 tablet, Rfl: 0   polyethylene glycol powder (MIRALAX) 17 GM/SCOOP powder, Miralax 17 gram/dose oral powder  Take by oral route., Disp: , Rfl:    propranolol  (INDERAL ) 20 MG tablet, Take 1 tablet (20 mg total) by mouth 3 (three) times daily as needed for panic attack., Disp: 270 tablet, Rfl: 1   SUMAtriptan (IMITREX) 25 MG tablet, Take by mouth., Disp: , Rfl:    SYRINGE-NEEDLE, DISP, 3 ML (LUER LOCK SAFETY SYRINGES) 25G X 1 3 ML MISC, Use with Vitamin B-12 intramuscular injection once a week, Disp: 4 each, Rfl: 0   triamcinolone  cream (KENALOG ) 0.1 %, Apply to itchy areas on body one to two times daily as needed, Disp: 30 g, Rfl: 0   albuterol  (VENTOLIN  HFA) 108 (90 Base) MCG/ACT inhaler, Inhale 2 puffs into the lungs every 6 (six) hours as needed for wheezing, Disp: 6.7 g, Rfl: 1   sertraline  (ZOLOFT ) 100 MG tablet, Take 1 tablet (100 mg total) by mouth daily., Disp: 90 tablet, Rfl: 1   traZODone  (DESYREL ) 50 MG tablet, Take 1/2 to 1 tablet by mouth at night 30-60 minutes before bed as needed for difficulty sleeping., Disp: 90 tablet, Rfl: 1  Allergies  Allergen Reactions   Cat Dander Other (See Comments)   Dust Mite Extract  Other (See Comments)   Molds & Smuts Nausea And Vomiting   Pollen Extract Other (See Comments)   ROS neg/noncontributory except as noted HPI/below      Objective:     BP 102/65   Pulse 85   Temp 98.1 F (36.7 C) (Temporal)   Resp 18   Ht 5' 7 (1.702 m)   Wt 156 lb (70.8 kg)   LMP 05/24/2024 (Approximate)   SpO2 95%   BMI 24.43 kg/m  Wt Readings from Last 3 Encounters:  06/20/24 156 lb (70.8 kg)  12/22/23 154 lb (69.9 kg)  07/27/23 154 lb 9.6 oz (70.1 kg)    Physical Exam   Gen: WDWN NAD HEENT: NCAT, conjunctiva not injected, sclera nonicteric TM WNL B, OP moist, no exudates  NECK:  supple, no thyromegaly, no nodes, no carotid bruits CARDIAC: RRR, S1S2+, no murmur. DP 2+B LUNGS: CTAB. No wheezes ABDOMEN:  BS+, soft, sl tender, No HSM, no masses EXT:  no edema MSK: no gross abnormalities.  NEURO: A&O x3.  CN II-XII intact.  PSYCH: normal mood. Good eye contact  Results for orders placed or performed in visit on 06/20/24  POCT rapid strep A   Collection Time: 06/20/24 11:37 AM  Result Value Ref Range   Rapid Strep A Screen Positive (A) Negative  POC COVID-19 BinaxNow   Collection Time: 06/20/24 11:38 AM  Result Value Ref Range   SARS Coronavirus 2 Ag Negative Negative        Assessment & Plan:  Anxiety and depression -     Sertraline  HCl; Take 1 tablet (100 mg total) by mouth daily.  Dispense: 90 tablet; Refill: 1  Primary insomnia -     traZODone  HCl; Take 1/2 to 1 tablet by mouth at night 30-60 minutes before bed as needed for difficulty sleeping.  Dispense: 90 tablet; Refill: 1  Mild intermittent asthma without complication -     Albuterol  Sulfate HFA; Inhale 2 puffs into the lungs every 6 (six) hours as needed for wheezing  Dispense: 6.7 g; Refill: 1  Sore throat -     POCT rapid strep A -     POC COVID-19 BinaxNow  Vomiting and diarrhea -     POC COVID-19 BinaxNow  Strep pharyngitis  Other orders -     Benzonatate ; Take 1 capsule (100 mg  total) by mouth 3 (three) times daily as needed.  Dispense: 20 capsule; Refill: 0 -     Ondansetron  HCl; Take 1 tablet (4 mg total) by mouth every 8 (eight) hours as needed for nausea or vomiting.  Dispense: 20 tablet; Refill: 0 -     Amoxicillin ; Take 1 capsule (500 mg total) by mouth 3 (three) times daily for 10 days.  Dispense: 30 capsule; Refill: 0  Assessment and Plan Assessment & Plan Depression   Her depression is well-managed with sertraline  100 mg daily, effectively stabilizing her mood without any suicidal ideation. Continue sertraline  100 mg daily and ensure refills are available.  Insomnia   She experiences insomnia with difficulty falling asleep, worsened by late work hours and possible trazodone  habituation. Currently, trazodone  50 mg is taken as needed but is less effective. Consider magnesium glycinate 500 mg at night and try meditation apps to aid sleep. Continue trazodone  50 mg as needed.  Streptococcal pharyngitis   A sore throat began a week ago, worsening with fever, chills, and body aches. A rapid strep test shows a faint line, suggesting streptococcal pharyngitis. The COVID test is negative. Prescribe amoxicillin , advising three doses today, spaced throughout the day. Consider retesting for COVID-19 if symptoms persist or worsen.  Nausea and vomiting   She experiences nausea and vomiting, with difficulty keeping fluids down, and describes vomiting as sand-colored bile. Emphasize maintaining hydration and prescribe Zofran  as needed. Encourage frequent sips of fluids.  Asthma   Her asthma is well-controlled, though she has considered using her inhaler due to chest symptoms. No recent refill of the albuterol  inhaler in over a year. Prescribe an albuterol  inhaler and ensure refills are available for Singulair .  Return in about 6 months (around 12/21/2024) for annual physical.  Jenkins CHRISTELLA Carrel, MD

## 2024-06-20 NOTE — Patient Instructions (Signed)
 It was very nice to see you today!  Magnesium glycinate 500mg  at night.  Meditation app   PLEASE NOTE:  If you had any lab tests please let us  know if you have not heard back within a few days. You may see your results on MyChart before we have a chance to review them but we will give you a call once they are reviewed by us . If we ordered any referrals today, please let us  know if you have not heard from their office within the next week.   Please try these tips to maintain a healthy lifestyle:  Eat most of your calories during the day when you are active. Eliminate processed foods including packaged sweets (pies, cakes, cookies), reduce intake of potatoes, white bread, white pasta, and white rice. Look for whole grain options, oat flour or almond flour.  Each meal should contain half fruits/vegetables, one quarter protein, and one quarter carbs (no bigger than a computer mouse).  Cut down on sweet beverages. This includes juice, soda, and sweet tea. Also watch fruit intake, though this is a healthier sweet option, it still contains natural sugar! Limit to 3 servings daily.  Drink at least 1 glass of water with each meal and aim for at least 8 glasses per day  Exercise at least 150 minutes every week.

## 2024-07-06 ENCOUNTER — Other Ambulatory Visit (HOSPITAL_BASED_OUTPATIENT_CLINIC_OR_DEPARTMENT_OTHER): Payer: Self-pay

## 2024-10-05 ENCOUNTER — Other Ambulatory Visit (HOSPITAL_BASED_OUTPATIENT_CLINIC_OR_DEPARTMENT_OTHER): Payer: Self-pay

## 2025-01-01 ENCOUNTER — Encounter: Admitting: Family Medicine
